# Patient Record
Sex: Female | Born: 1950
Health system: Southern US, Community
[De-identification: ages and names within clinical notes are randomized; demographics above are authoritative.]

## PROBLEM LIST (undated history)

## (undated) DIAGNOSIS — F32A Depression, unspecified: Secondary | ICD-10-CM

## (undated) DIAGNOSIS — F329 Major depressive disorder, single episode, unspecified: Secondary | ICD-10-CM

## (undated) DIAGNOSIS — C801 Malignant (primary) neoplasm, unspecified: Secondary | ICD-10-CM

## (undated) DIAGNOSIS — E039 Hypothyroidism, unspecified: Secondary | ICD-10-CM

## (undated) DIAGNOSIS — F419 Anxiety disorder, unspecified: Secondary | ICD-10-CM

## (undated) DIAGNOSIS — M1990 Unspecified osteoarthritis, unspecified site: Secondary | ICD-10-CM

## (undated) DIAGNOSIS — K635 Polyp of colon: Secondary | ICD-10-CM

## (undated) DIAGNOSIS — Z8719 Personal history of other diseases of the digestive system: Secondary | ICD-10-CM

## (undated) DIAGNOSIS — E785 Hyperlipidemia, unspecified: Secondary | ICD-10-CM

## (undated) DIAGNOSIS — G43909 Migraine, unspecified, not intractable, without status migrainosus: Secondary | ICD-10-CM

## (undated) HISTORY — DX: Personal history of other diseases of the digestive system: Z87.19

## (undated) HISTORY — DX: Hyperlipidemia, unspecified: E78.5

## (undated) HISTORY — DX: Polyp of colon: K63.5

## (undated) HISTORY — PX: ECTOPIC PREGNANCY SURGERY: SHX613

## (undated) HISTORY — PX: ESOPHAGOGASTRODUODENOSCOPY (EGD) WITH ESOPHAGEAL DILATION: SHX5812

## (undated) HISTORY — PX: ABDOMINAL HYSTERECTOMY: SHX81

## (undated) HISTORY — PX: TONSILLECTOMY: SUR1361

---

## 2000-06-10 ENCOUNTER — Encounter: Admission: RE | Admit: 2000-06-10 | Discharge: 2000-06-10 | Payer: Self-pay | Admitting: Obstetrics and Gynecology

## 2000-06-10 ENCOUNTER — Encounter: Payer: Self-pay | Admitting: Obstetrics and Gynecology

## 2000-11-05 ENCOUNTER — Inpatient Hospital Stay (HOSPITAL_COMMUNITY): Admission: AD | Admit: 2000-11-05 | Discharge: 2000-11-05 | Payer: Self-pay | Admitting: Obstetrics and Gynecology

## 2000-11-05 ENCOUNTER — Encounter: Payer: Self-pay | Admitting: Obstetrics and Gynecology

## 2000-11-17 ENCOUNTER — Encounter (INDEPENDENT_AMBULATORY_CARE_PROVIDER_SITE_OTHER): Payer: Self-pay | Admitting: Specialist

## 2000-11-17 ENCOUNTER — Observation Stay (HOSPITAL_COMMUNITY): Admission: RE | Admit: 2000-11-17 | Discharge: 2000-11-18 | Payer: Self-pay | Admitting: Obstetrics and Gynecology

## 2000-11-17 HISTORY — PX: BILATERAL SALPINGOOPHORECTOMY: SHX1223

## 2000-11-17 HISTORY — PX: APPENDECTOMY: SHX54

## 2001-05-12 ENCOUNTER — Encounter: Admission: RE | Admit: 2001-05-12 | Discharge: 2001-05-12 | Payer: Self-pay | Admitting: Obstetrics and Gynecology

## 2001-05-12 ENCOUNTER — Encounter: Payer: Self-pay | Admitting: Obstetrics and Gynecology

## 2002-05-15 ENCOUNTER — Encounter: Payer: Self-pay | Admitting: Obstetrics and Gynecology

## 2002-05-15 ENCOUNTER — Encounter: Admission: RE | Admit: 2002-05-15 | Discharge: 2002-05-15 | Payer: Self-pay | Admitting: Obstetrics and Gynecology

## 2002-09-10 ENCOUNTER — Encounter: Payer: Self-pay | Admitting: Internal Medicine

## 2002-09-10 ENCOUNTER — Ambulatory Visit (HOSPITAL_COMMUNITY): Admission: RE | Admit: 2002-09-10 | Discharge: 2002-09-10 | Payer: Self-pay | Admitting: Internal Medicine

## 2002-09-27 ENCOUNTER — Ambulatory Visit (HOSPITAL_COMMUNITY): Admission: RE | Admit: 2002-09-27 | Discharge: 2002-09-27 | Payer: Self-pay | Admitting: Internal Medicine

## 2003-05-17 ENCOUNTER — Encounter: Admission: RE | Admit: 2003-05-17 | Discharge: 2003-05-17 | Payer: Self-pay | Admitting: Obstetrics and Gynecology

## 2003-05-17 ENCOUNTER — Encounter: Payer: Self-pay | Admitting: Obstetrics and Gynecology

## 2009-12-11 ENCOUNTER — Ambulatory Visit: Payer: Self-pay | Admitting: Orthopedic Surgery

## 2009-12-11 DIAGNOSIS — M7710 Lateral epicondylitis, unspecified elbow: Secondary | ICD-10-CM | POA: Insufficient documentation

## 2010-11-03 ENCOUNTER — Ambulatory Visit (HOSPITAL_COMMUNITY)
Admission: RE | Admit: 2010-11-03 | Discharge: 2010-11-03 | Payer: Self-pay | Source: Home / Self Care | Attending: Internal Medicine | Admitting: Internal Medicine

## 2010-11-03 ENCOUNTER — Ambulatory Visit: Admit: 2010-11-03 | Payer: Self-pay | Admitting: Internal Medicine

## 2010-11-17 NOTE — Letter (Signed)
Summary: Generic Letter  [facility]  [address]  [place], [place] [address]   Phone: [phone]  Fax: [phone]    12/11/2009  APPLY ICE TO THE AREA OF MAXIMAL PAIN FOR 20 MINUTES two times a day   Fuller Canada MD

## 2010-11-17 NOTE — Letter (Signed)
Summary: History form  History form   Imported By: Jacklynn Ganong 12/23/2009 09:42:35  _____________________________________________________________________  External Attachment:    Type:   Image     Comment:   External Document

## 2010-11-17 NOTE — Assessment & Plan Note (Signed)
Summary: RT ELBOW PAIN/NEEDS XRAY/BCBS/CAF   Vital Signs:  Patient profile:   60 year old female Weight:      148 pounds Pulse rate:   62 / minute Resp:     18 per minute  Vitals Entered By: Fuller Canada MD (December 11, 2009 11:35 AM)  Visit Type:  Initial Consult Referring Provider:  self Primary Provider:  Dr. Ouida Sills  CC:  right elbow.  History of Present Illness: This is a 60 year old female with complaint of 4 months of atraumatic onset of RIGHT elbow pain.  She denies any trauma but she loves the garden and the yard and has started to prepare for the sprain.  She has sharp and dull throbbing stabbing moderate pain in the lateral side of the RIGHT elbow which is worse after any use of the RIGHT upper extremity.  She thinks it came on suddenly it is relieved with rest.  She has tried to take some Advil 400 mg on a p.r.n. basis and use ice with minimal to mild relief.  She did not have any numbness or tingling locking or catching.  She does note that she cannot pick up anything with any weight to it.   Needs xrays today.  Meds: Citalopram and Estradiol.    Allergies (verified): No Known Drug Allergies  Past History:  Past Medical History: morton's neuroma anxiety depression  Past Surgical History: hysterectomy  Family History: Family History Coronary Heart Disease female < 55  Social History: Patient is married.  no smoking occasional alcohol 2 cups of coffee per day.  Review of Systems General:  Denies weight loss, weight gain, fever, chills, and fatigue. Cardiac :  Denies chest pain, angina, heart attack, heart failure, poor circulation, blood clots, and phlebitis. Resp:  Denies short of breath, difficulty breathing, COPD, cough, and pneumonia; snoring. GI:  Denies nausea, vomiting, diarrhea, constipation, difficulty swallowing, ulcers, GERD, and reflux. GU:  Denies kidney failure, kidney transplant, kidney stones, burning, poor stream, testicular  cancer, blood in urine, and . Neuro:  Denies headache, dizziness, migraines, numbness, weakness, tremor, and unsteady walking. MS:  Denies joint pain, rheumatoid arthritis, joint swelling, gout, bone cancer, osteoporosis, and ; stiffness. Endo:  Denies thyroid disease, goiter, and diabetes. Psych:  Denies depression, mood swings, anxiety, panic attack, bipolar, and schizophrenia. Derm:  Denies eczema, cancer, and itching. EENT:  Complains of ears ringing; denies poor vision, cataracts, glaucoma, poor hearing, vertigo, sinusitis, hoarseness, toothaches, and bleeding gums; headache. Immunology:  Complains of seasonal allergies; denies sinus problems and allergic to bee stings. Lymphatic:  Denies lymph node cancer and lymph edema.  Physical Exam  Additional Exam:   VS reviewed and were normal  GEN: appearance was normal   CDV: normal pulses temperature and no edema  LYMPH nodes were normal   SKIN was normal   Neuro: normal sensation Psyche: AAO x 3 and mood was normal   MSK *Gait was normal   RIGHT elbow Inspection: Tenderness over the lateral epicondyle and radial head Range of motion full flexion extension and pronation supination Motor exam normal flexion and extension power valgus stress test normal pivot shift test normal  Tennis elbow test positive    Impression & Recommendations:  Problem # 1:  LATERAL EPICONDYLITIS (ICD-726.32) Assessment New  x-rays of the RIGHT elbow 2 views were ordered today  There is a slight spur on the medial side of the elbow at the humeral ulnar articulation but the ulnohumeral articulation radially and laterally is otherwise normal  Impression normal RIGHT elbow with the exception of a small ulnohumeral spur.  Inject RIGHT elbow The  elbow was prepped with alcohol and anesthetized with ethyl chloride. 40 mg of Depo-Medrol and 5 cc of 1% lidocaine were injected along the lateral epicondyle. No complications. Verbal consent was obtained  prior to injection.  Orders: New Patient Level III (84132) Joint Aspirate / Injection, Large (20610) Depo- Medrol 40mg  (J1030) Elbow x-ray, 2 views (44010)  Patient Instructions: 1)  You have Tennis Elbow  2)  wear the rarce and do the exrcises for 6 weeks  3)  Take Aleve daily for 2 weeks  4)  Please schedule a follow-up appointment as needed.

## 2010-11-20 NOTE — Op Note (Signed)
  Martha Hendricks, Martha Hendricks             ACCOUNT NO.:  [id number]  MEDICAL RECORD NO.:  [id number]          PATIENT TYPE:  AMB  LOCATION:  DAY                           FACILITY:  [facility]  PHYSICIAN:  Lionel December, M.D.    DATE OF BIRTH:  09-27-1951  DATE OF PROCEDURE:  11/03/2010 DATE OF DISCHARGE:                              OPERATIVE REPORT   PROCEDURE:  Colonoscopy.  INDICATIONS:  The patient is a 60 year old Caucasian female who is undergoing diagnostic colonoscopy.  She recently developed bloody diarrhea which resolved spontaneously.  The patient's last colonoscopy was over 8 years ago.  Family history is positive for various malignancies including colon carcinoma in paternal grandmother and two other second-degree relatives but they are all passed 81.  Procedure and risks were reviewed.  The patient's informed consent was obtained.  MEDICATIONS:  Conscious sedation with Demerol 40 mg IV, Versed 5 mg IV.  FINDINGS:  Procedure performed in endoscopy suite.  The patient's vital signs and O2 sats were monitored during the procedure and remained stable.  The patient was placed in left lateral position and rectal examination performed.  No abnormality noted on external or digital exam.  Pentax videoscope was placed in rectum and advanced under vision into sigmoid colon and beyond.  Preparation was excellent.  Scope was passed into cecum which was identified by appendiceal orifice and ileocecal valve.  Pictures taken for the record.  A single small diverticulum was also noted on the way in.  As the scope was withdrawn, colonic mucosa was carefully examined.  There was a 3-mm polyp at the transverse colon which was cold snared and retrieved for histologic examination.  Mucosa of rest of the colon was normal.  Rectal mucosa similarly was normal.  Scope was retroflexed and examined anorectal junction which was unremarkable.  Endoscope was then withdrawn. Withdrawal time was 13 minutes.   The patient tolerated the procedure well.  FINAL DIAGNOSIS: 1. Examination performed to cecum. 2. No evidence of endoscopic colitis. 3. Single diverticulum at sigmoid colon. 4. A 3-mm polyp cold snared from transverse colon.  RECOMMENDATIONS:  Standard instructions given.  I will be contacting the patient with results of biopsy and further recommendations.     Lionel December, M.D.     NR/MEDQ  D:  11/03/2010  T:  11/03/2010  Job:  [id number]  cc:   Kingsley Callander. Ouida Sills, MD Fax: [phone]  Electronically Signed by Lionel December M.D. on 11/20/2010 12:55:49 PM

## 2011-03-05 NOTE — Op Note (Signed)
NAME:  Martha Hendricks, Martha Hendricks                           ACCOUNT NO.:  [id number]   MEDICAL RECORD NO.:  [id number]                   PATIENT TYPE:  AMB   LOCATION:  DAY                                  FACILITY:  [facility]   PHYSICIAN:  Lionel December, M.D.                 DATE OF BIRTH:  05/14/1951   DATE OF PROCEDURE:  09/26/2002  DATE OF DISCHARGE:                                 OPERATIVE REPORT   PROCEDURE:  Total colonoscopy.   INDICATIONS:  The patient is a 60 year old Caucasian female who is  undergoing screening colonoscopy.  She does not have any GI symptoms.  Her  younger brother has had colonic polyps removed.  She has had two aunts on  her father's side with colon cancer, and she believes that her maternal  grandmother also had colon cancer.  The procedure risks were reviewed with  the patient and informed consent was obtained.   PREMEDICATION:  Demerol 50 mg IV in divided dose, Versed 5 mg IV in divided  dose.   INSTRUMENT USED:  Olympus video system.   FINDINGS:  Procedure performed in endoscopy suite.  The patient's vital  signs and O2 saturation were monitored during the procedure and remained  stable.  The patient was placed in the left lateral recumbent position and  rectal examination performed.  The pediatric scope was used.  The rectal  examination performed.  No abnormality noted on external or digital exam.  The scope was placed in the rectum and advanced under vision into sigmoid  colon.  Preparation was excellent.  Somewhat redundant sigmoid colon.  A  loop could never be reduced, able to advance the scope using abdominal  pressure.  The scope was passed into cecum, which was identified by  ileocecal valve.  Blunt end of the cecum was normal.  As the scope was  withdrawn, colonic mucosa was once again carefully examined.  There were two  small diverticula at the transverse colon, otherwise normal exam.  The  rectal mucosa was normal.  The scope was retroflexed to  examine the  anorectal junction, and small hemorrhoids were noted below the dentate line.  The endoscope was then withdrawn.  The patient tolerated the procedure well.   FINAL DIAGNOSES:  1. Examination performed to cecum.  2. Two small diverticula at transverse colon and small external hemorrhoids,     otherwise normal exam.  3. Somewhat redundant colon.    RECOMMENDATIONS:  1. She should continue yearly Hemoccults.  2. May consider having another exam in five to seven years.                                               Lionel December, M.D.    NR/MEDQ  D:  09/27/2002  T:  09/27/2002  Job:  [id number]   cc:   Kingsley Callander. Ouida Sills, M.D.  [address]  [place]  [place] [address]  Fax: [phone]   S. Kyra Manges, M.D.  [phone] [address]  [place]  [place] [address]  Fax: [phone]

## 2011-03-05 NOTE — Op Note (Signed)
[facility]  Patient:    FRADEL, BALDONADO                        MRN: [id number] Proc. Date: 11/17/00 Adm. Date:  [id number] Attending:  Lendon Colonel                           Operative Report  PREOPERATIVE DIAGNOSIS:   Cyclic abdominal pain, suspect endometriosis of ovaries.  POSTOPERATIVE DIAGNOSIS:  Cyclic abdominal pain, suspect endometriosis of ovaries.  OPERATION:  Laparoscopy with exploratory laparotomy, bilateral salpingo-oophorectomy and appendectomy.  SURGEON:  Katherine Roan, M.D.  DESCRIPTION OF PROCEDURE:   The patient was placed in the semilithotomy position for operative laparoscopy.  A transverse incision was made in the umbilicus and the abdomen was distended with CO2 under low pressure. Aspiration and infusion technique was utilized.  Visualization of the abdomen was accomplished after three liters of C02 were infused into the patients abdomen.  The ovary on the right was adherent to the pelvic side wall and stuck to the appendix with an inflammatory mass consistent with recent ruptured endometrioma.  The left ovary was somewhat adherent but not nearly as adherent as the right.  The ovary was stuck down to the right ureter.  The decision was made then to convert to an open laparotomy.  A transverse incision was made in the abdomen and extended in layers to the fascia which was opened transversely.  The peritoneum was entered and opened vertically and the ovary was in the infundibulopelvic ligament on the right.  It was elevated, skeletonized, and ligated with 0 chromic suture, and the ovary was freed up very carefully.  The adhesions were noted in the pelvic sidewall and over the left ureter.  Inflammatory mass was removed.  There was a bleeder on the surface of the pelvic sidewall and on the appendix on the surface of the colon.  This was sutured with 3-0 Vicryl suture.  There was oozing in the peritoneal reflection over  the right infundibulopelvic ligament which was likewise suture ligated.  The left ovary was removed fairly easily, isolating the infundibulopelvic ligament and ligating this with 0 chromic suture and removing the ovary fairly easily.  Hemostasis was secured since the tip of the appendix appeared to have endometriosis or be involved in endometriosis at the onset.  I removed the appendix using 3-0 Vicryl for the mesoappendix, crushing the base of the appendix and ligating it with 0 Vicryl and then inverting the stump with 3-0 Vicryl pursestring.  Copious amounts of irrigation were used to ensure hemostasis, and the parietal peritoneum was closed with 2-0 PDS as was the fascia and two interrupted sutures of 0 Vicryl were placed in the fascia. Subcutaneum was noted to be secure and irrigated with copious amounts of saline.  The skin was then closed with clips.  Dry sterile dressing was applied, and she was awakened and carried to the recovery room in good condition. DD:  11/17/00 TD:  11/17/00 Job: [id number]

## 2011-03-05 NOTE — H&P (Signed)
[facility]  Patient:    Martha Hendricks, Martha Hendricks                        MRN: [id number] Adm. Date:  [id number] Attending:  Lendon Colonel                         History and Physical  CHIEF COMPLAINT:  Continued abdominal pain.  HISTORY OF PRESENT ILLNESS:  Martha Hendricks is a 60 year old, gravida 3, para 2 female who presents for laparoscopy and laparoscopic bilateral oophorectomy for cyclic pain.  She has a history of ectopic pregnancy and two spontaneous deliveries.  At the time of laparoscopic-assisted hysterectomy in 1994, she had focal endometriosis of the uterosacral ligaments and of the ovary.  She continues to complain of cyclic pain.  Her only significant other surgery has been exploratory laparotomy for ectopic pregnancy in 1988 and she had a tubal ligation in 1989.  Because of the continued cyclic pain, bilateral salpingo-oophorectomy is recommended.  REVIEW OF SYSTEMS:  HEENT:  She wears glasses, but no headaches.  No decrease in vision or auditory acuity.  No dizziness.  Heart:  No rheumatic fever.  No chest pain.  No history of hypertension.  No history of mitral valve prolapse. Lungs:  No chronic cough.  No asthma.  No wheezing.  No hemoptysis.  GU:  She denies stress urinary incontinence.  No frequent UTIs.  GI:  No bowel habit change.  No anorexia.  No weight loss or gain.  Muscles, Bones, and Joints: No fractures or arthritis.  SOCIAL HISTORY:  She is a Runner, broadcasting/film/video.  She drinks alcohol socially.  She does not smoke.  FAMILY HISTORY:  Her mother is 5 and has COPD and high blood pressure, as well as congestive heart failure.  Her father is 79 and has Alzheimers.  He has had a coronary stent.  She is an only child.  She has a maternal uncle with lung cancer and a maternal aunt with ovarian cancer.  Her father has cancer of the prostate.  She has paternal aunts with colon cancer and a paternal grandmother with breast and colon cancer.  There is  no diabetes in the family.  PHYSICAL EXAMINATION:  A well-developed, well-nourished female who appears to be her stated age.  She is oriented to time, place, and recent events.  VITAL SIGNS:  The blood pressure is 90/60.  WEIGHT:  133 pounds.  HEENT:  Examination is unremarkable.  The oropharynx is not injected.  NECK:  Supple.  The thyroid is not enlarged.  Carotid pulses are equal without bruits.  No adenopathy appreciated.  BREASTS:  No masses or tenderness.  HEART:  Normal sinus rhythm.  No murmur.  LUNGS:  Clear to auscultation and percussion.  ABDOMEN:  Soft and flat.  Liver, spleen, and kidneys not palpated.  Bowel sounds are normal.  No masses are felt.  No bruits heard.  There is a transverse incision in the lower abdomen that appears to be well healed without evidence of hernia.  EXTREMITIES:  Good pulses and equal reflexes bilaterally.  PELVIC:  A well-supported vaginal vault.  No palpable masses.  There is some tenderness along the right uterosacral ligament.  NEUROLOGIC:  Cranial nerves are intact.  Reflexes in both upper extremities are equal.  IMPRESSION:  Cyclic abdominal pain.  Suspect endometriosis of the ovaries.  PLAN:  Laparoscopic bilateral salpingo-oophorectomy and possible laparotomy. Detailed informed consent  has been given to the patient in the form of risks of surgery, including bowel and vascular infection-type complications. DD:  11/17/00 TD:  11/17/00 Job: [id number]

## 2012-09-27 ENCOUNTER — Other Ambulatory Visit: Payer: Self-pay | Admitting: Obstetrics and Gynecology

## 2013-09-05 ENCOUNTER — Emergency Department (HOSPITAL_COMMUNITY)
Admission: EM | Admit: 2013-09-05 | Discharge: 2013-09-05 | Disposition: A | Discharge status: Home / Self Care | Payer: BC Managed Care – PPO | Attending: Emergency Medicine | Admitting: Emergency Medicine

## 2013-09-05 ENCOUNTER — Encounter (HOSPITAL_COMMUNITY): Payer: Self-pay | Admitting: Emergency Medicine

## 2013-09-05 ENCOUNTER — Emergency Department (HOSPITAL_COMMUNITY): Payer: BC Managed Care – PPO

## 2013-09-05 DIAGNOSIS — Z79899 Other long term (current) drug therapy: Secondary | ICD-10-CM | POA: Insufficient documentation

## 2013-09-05 DIAGNOSIS — R51 Headache: Secondary | ICD-10-CM | POA: Insufficient documentation

## 2013-09-05 DIAGNOSIS — I10 Essential (primary) hypertension: Secondary | ICD-10-CM | POA: Insufficient documentation

## 2013-09-05 HISTORY — DX: Migraine, unspecified, not intractable, without status migrainosus: G43.909

## 2013-09-05 MED ORDER — METOCLOPRAMIDE HCL 5 MG/ML IJ SOLN
10.0000 mg | Freq: Once | INTRAMUSCULAR | Status: AC
  Administered 2013-09-05: 10 mg via INTRAVENOUS
  Filled 2013-09-05: qty 2

## 2013-09-05 MED ORDER — HYDROMORPHONE HCL PF 1 MG/ML IJ SOLN
1.0000 mg | Freq: Once | INTRAMUSCULAR | Status: AC
  Administered 2013-09-05: 1 mg via INTRAVENOUS
  Filled 2013-09-05: qty 1

## 2013-09-05 MED ORDER — KETOROLAC TROMETHAMINE 30 MG/ML IJ SOLN
30.0000 mg | Freq: Once | INTRAMUSCULAR | Status: AC
  Administered 2013-09-05: 30 mg via INTRAVENOUS
  Filled 2013-09-05: qty 1

## 2013-09-05 MED ORDER — DIPHENHYDRAMINE HCL 50 MG/ML IJ SOLN
25.0000 mg | Freq: Once | INTRAMUSCULAR | Status: AC
  Administered 2013-09-05: 25 mg via INTRAVENOUS
  Filled 2013-09-05: qty 1

## 2013-09-05 NOTE — ED Notes (Signed)
Pt c/o severe, sudden headache that began approximately 45 minutes ago. Pt's BP was also elevated at time of onset. Pt states she received "upsetting news" and the headache began shortly after. Pt also reports nausea. Pt denies chest pain, weakness.

## 2013-09-05 NOTE — ED Notes (Signed)
Pt reports received some disturbing news and was upset.  Reports approx 6pm  Had "exploding" headache in the back of her head.  Family checked bp and reports was 160/101.  PT took 2 apap/butalbital/caff325-50-40mg  tabs

## 2013-09-05 NOTE — ED Provider Notes (Signed)
CSN: [id number]     Arrival date & time 09/05/13  1835 History   First MD Initiated Contact with Patient 09/05/13 1854     Chief Complaint  Patient presents with  . Headache  . Hypertension   (Consider location/radiation/quality/duration/timing/severity/associated sxs/prior Treatment) Patient is a 62 y.o. female presenting with headaches and hypertension. The history is provided by the patient (the pt states she was crying and upset and then started with a severe headache).  Headache Pain location:  Generalized Quality:  Dull Radiates to:  Does not radiate Severity currently:  8/10 Severity at highest:  9/10 Associated symptoms: no abdominal pain, no back pain, no congestion, no cough, no diarrhea, no fatigue, no seizures and no sinus pressure   Hypertension Associated symptoms include headaches. Pertinent negatives include no chest pain and no abdominal pain.    Past Medical History  Diagnosis Date  . Migraines    Past Surgical History  Procedure Laterality Date  . Abdominal hysterectomy    . Ectopic pregnancy surgery     No family history on file. History  Substance Use Topics  . Smoking status: Never Smoker   . Smokeless tobacco: Not on file  . Alcohol Use: Yes     Comment: occ   OB History   Grav Para Term Preterm Abortions TAB SAB Ect Mult Living                 Review of Systems  Constitutional: Negative for appetite change and fatigue.  HENT: Negative for congestion, ear discharge and sinus pressure.   Eyes: Negative for discharge.  Respiratory: Negative for cough.   Cardiovascular: Negative for chest pain.  Gastrointestinal: Negative for abdominal pain and diarrhea.  Genitourinary: Negative for frequency and hematuria.  Musculoskeletal: Negative for back pain.  Skin: Negative for rash.  Neurological: Positive for headaches. Negative for seizures.  Psychiatric/Behavioral: Negative for hallucinations.    Allergies  Morphine and related  Home  Medications   Current Outpatient Rx  Name  Route  Sig  Dispense  Refill  . butalbital-acetaminophen-caffeine (FIORICET, ESGIC) 50-325-40 MG per tablet   Oral   Take 1 tablet by mouth every 6 (six) hours as needed for headache.          . citalopram (CELEXA) 20 MG tablet   Oral   Take 20 mg by mouth daily.         Marland Kitchen estradiol (ESTRACE) 0.5 MG tablet   Oral   Take 1 tablet by mouth 2 (two) times daily.          BP 124/64  Pulse 72  Resp 14  SpO2 96% Physical Exam  Constitutional: She is oriented to person, place, and time. She appears well-developed.  HENT:  Head: Normocephalic.  Eyes: Conjunctivae and EOM are normal. No scleral icterus.  Neck: Neck supple. No thyromegaly present.  Cardiovascular: Normal rate and regular rhythm.  Exam reveals no gallop and no friction rub.   No murmur heard. Pulmonary/Chest: No stridor. She has no wheezes. She has no rales. She exhibits no tenderness.  Abdominal: She exhibits no distension. There is no tenderness. There is no rebound.  Musculoskeletal: Normal range of motion. She exhibits no edema.  Lymphadenopathy:    She has no cervical adenopathy.  Neurological: She is oriented to person, place, and time. She exhibits normal muscle tone. Coordination normal.  Skin: No rash noted. No erythema.  Psychiatric: She has a normal mood and affect. Her behavior is normal.    ED  Course  Procedures (including critical care time) Labs Review Labs Reviewed - No data to display Imaging Review Ct Head Wo Contrast  09/05/2013   CLINICAL DATA:  Headache  EXAM: CT HEAD WITHOUT CONTRAST  TECHNIQUE: Contiguous axial images were obtained from the base of the skull through the vertex without intravenous contrast.  COMPARISON:  None.  FINDINGS: Minimal right-sided periventricular hypodensities suggestive of microvascular ischemic disease (representative axial images 19 and 20, series 2). Gray-white differentiation is otherwise well maintained. No CT  evidence of acute large territory infarct. No intraparenchymal or extra-axial mass or hemorrhage. Normal size and configuration of the ventricles and basilar cisterns. No midline shift. Limited visualization the paranasal sinuses and mastoid air cells are normal. Regional soft tissues are normal. No displaced calvarial fracture.  IMPRESSION: Minimal microvascular ischemic disease without acute intracranial process.   Electronically Signed   By: Simonne Come M.D.   On: 09/05/2013 19:44    EKG Interpretation   None       MDM   1. Headache    Pt improved with tx    Benny Lennert, MD 09/05/13 [phone]

## 2013-09-27 ENCOUNTER — Other Ambulatory Visit: Payer: Self-pay | Admitting: Obstetrics and Gynecology

## 2014-10-24 ENCOUNTER — Other Ambulatory Visit: Payer: Self-pay | Admitting: Obstetrics and Gynecology

## 2014-10-25 LAB — CYTOLOGY - PAP

## 2015-02-17 ENCOUNTER — Ambulatory Visit (HOSPITAL_COMMUNITY)
Admission: RE | Admit: 2015-02-17 | Discharge: 2015-02-17 | Disposition: A | Discharge status: Home / Self Care | Payer: BC Managed Care – PPO | Source: Ambulatory Visit | Attending: Internal Medicine | Admitting: Internal Medicine

## 2015-02-17 ENCOUNTER — Other Ambulatory Visit (HOSPITAL_COMMUNITY): Payer: Self-pay | Admitting: Internal Medicine

## 2015-02-17 DIAGNOSIS — R221 Localized swelling, mass and lump, neck: Secondary | ICD-10-CM

## 2015-02-17 DIAGNOSIS — E042 Nontoxic multinodular goiter: Secondary | ICD-10-CM | POA: Insufficient documentation

## 2015-02-18 ENCOUNTER — Other Ambulatory Visit (HOSPITAL_COMMUNITY): Payer: Self-pay | Admitting: Internal Medicine

## 2015-02-18 DIAGNOSIS — E041 Nontoxic single thyroid nodule: Secondary | ICD-10-CM

## 2015-02-19 ENCOUNTER — Ambulatory Visit (HOSPITAL_COMMUNITY)
Admission: RE | Admit: 2015-02-19 | Discharge: 2015-02-19 | Disposition: A | Discharge status: Home / Self Care | Payer: BC Managed Care – PPO | Source: Ambulatory Visit | Attending: Internal Medicine | Admitting: Internal Medicine

## 2015-02-19 DIAGNOSIS — E041 Nontoxic single thyroid nodule: Secondary | ICD-10-CM | POA: Diagnosis present

## 2015-02-19 MED ORDER — LIDOCAINE HCL (PF) 2 % IJ SOLN
INTRAMUSCULAR | Status: AC
  Filled 2015-02-19: qty 10

## 2015-02-19 MED ORDER — LIDOCAINE HCL (PF) 2 % IJ SOLN
INTRAMUSCULAR | Status: AC
  Administered 2015-02-19: 10 mL
  Filled 2015-02-19: qty 10

## 2015-02-19 NOTE — Procedures (Signed)
PreOperative Dx: RIGHT lobe thyroid mass Postoperative Dx: RIGHT lobe thyroid mass Procedure:   US guided FNA of RIGHT thyroid mass Radiologist:  Thornton Papas Anesthesia:  2 ml of 2% lidocaine Specimen:  FNA x 4  EBL:   < 1 ml Complications: None

## 2015-02-19 NOTE — Discharge Instructions (Signed)
Thyroid Biopsy °The thyroid gland is a butterfly-shaped gland situated in the front of the neck. It produces hormones which affect metabolism, growth and development, and body temperature. A thyroid biopsy is a procedure in which small samples of tissue or fluid are removed from the thyroid gland or mass and examined under a microscope. This test is done to determine the cause of thyroid problems, such as infection, cancer, or other thyroid problems. °There are 2 ways to obtain samples: °1. Fine needle biopsy. Samples are removed using a thin needle inserted through the skin and into the thyroid gland or mass. °2. Open biopsy. Samples are removed after a cut (incision) is made through the skin. °LET YOUR CAREGIVER KNOW ABOUT:  °· Allergies. °· Medications taken including herbs, eye drops, over-the-counter medications, and creams. °· Use of steroids (by mouth or creams). °· Previous problems with anesthetics or numbing medicine. °· Possibility of pregnancy, if this applies. °· History of blood clots (thrombophlebitis). °· History of bleeding or blood problems. °· Previous surgery. °· Other health problems. °RISKS AND COMPLICATIONS °· Bleeding from the site. The risk of bleeding is higher if you have a bleeding disorder or are taking any blood thinning medications (anticoagulants). °· Infection. °· Injury to structures near the thyroid gland. °BEFORE THE PROCEDURE  °This is a procedure that can be done as an outpatient. Confirm the time that you need to arrive for your procedure. Confirm whether there is a need to fast or withhold any medications. A blood sample may be done to determine your blood clotting time. Medicine may be given to help you relax (sedative). °PROCEDURE °Fine needle biopsy. °You will be awake during the procedure. You may be asked to lie on your back with your head tipped backward to extend your neck. Let your caregiver know if you cannot tolerate the positioning. An area on your neck will be  cleansed. A needle is inserted through the skin of your neck. You may feel a mild discomfort during this procedure. You may be asked to avoid coughing, talking, swallowing, or making sounds during some portions of the procedure. The needle is withdrawn once tissue or fluid samples have been removed. Pressure may be applied to the neck to reduce swelling and ensure that bleeding has stopped. The samples will be sent for examination.  °Open biopsy. °You will be given general anesthesia. You will be asleep during the procedure. An incision is made in your neck. A sample of thyroid tissue or the mass is removed. The tissue sample or mass will be sent for examination. The sample or mass may be examined during the biopsy. If the sample or mass contains cancer cells, some or all of the thyroid gland may be removed. The incision is closed with stitches. °AFTER THE PROCEDURE  °Your recovery will be assessed and monitored. If there are no problems, as an outpatient, you should be able to go home shortly after the procedure. °If you had a fine needle biopsy: °· You may have soreness at the biopsy site for 1 to 2 days. °If you had an open biopsy:  °· You may have soreness at the biopsy site for 3 to 4 days. °· You may have a hoarse voice or sore throat for 1 to 2 days. °Obtaining the Test Results °It is your responsibility to obtain your test results. Do not assume everything is normal if you have not heard from your caregiver or the medical facility. It is important for you to follow up   on all of your test results. °HOME CARE INSTRUCTIONS  °· Keeping your head raised on a pillow when you are lying down may ease biopsy site discomfort. °· Supporting the back of your head and neck with both hands as you sit up from a lying position may ease biopsy site discomfort. °· Only take over-the-counter or prescription medicines for pain, discomfort, or fever as directed by your caregiver. °· Throat lozenges or gargling with warm salt  water may help to soothe a sore throat. °SEEK IMMEDIATE MEDICAL CARE IF:  °· You have severe bleeding from the biopsy site. °· You have difficulty swallowing. °· You have a fever. °· You have increased pain, swelling, redness, or warmth at the biopsy site. °· You notice pus coming from the biopsy site. °· You have swollen glands (lymph nodes) in your neck. °Document Released: 08/01/2007 Document Revised: 01/29/2013 Document Reviewed: 12/27/2013 °ExitCare® Patient Information ©2015 [employer]. This information is not intended to replace advice given to you by your health care provider. Make sure you discuss any questions you have with your health care provider. ° °

## 2015-03-04 ENCOUNTER — Other Ambulatory Visit: Payer: Self-pay | Admitting: Otolaryngology

## 2015-03-04 DIAGNOSIS — D440 Neoplasm of uncertain behavior of thyroid gland: Secondary | ICD-10-CM

## 2015-03-11 ENCOUNTER — Ambulatory Visit
Admission: RE | Admit: 2015-03-11 | Discharge: 2015-03-11 | Disposition: A | Discharge status: Home / Self Care | Payer: BC Managed Care – PPO | Source: Ambulatory Visit | Attending: Otolaryngology | Admitting: Otolaryngology

## 2015-03-11 DIAGNOSIS — D440 Neoplasm of uncertain behavior of thyroid gland: Secondary | ICD-10-CM

## 2015-03-11 MED ORDER — IOHEXOL 300 MG/ML  SOLN
75.0000 mL | Freq: Once | INTRAMUSCULAR | Status: AC | PRN
  Administered 2015-03-11: 75 mL via INTRAVENOUS

## 2015-03-18 NOTE — Pre-Procedure Instructions (Signed)
    BECCA BAYNE  03/18/2015      [place] [place] [address] [place] [address] Phone: [phone] Fax: [phone]    Your procedure is scheduled on 03/21/15.  Report to [facility] Admitting at 7 A.M.  Call this number if you have problems the morning of surgery:  [phone]   Remember:  Do not eat food or drink liquids after midnight.  Take these medicines the morning of surgery with A SIP OF WATER fioricet,celexa,estrace   Do not wear jewelry, make-up or nail polish.  Do not wear lotions, powders, or perfumes.  You may wear deodorant.  Do not shave 48 hours prior to surgery.  Men may shave face and neck.  Do not bring valuables to the hospital.  [facility] is not responsible for any belongings or valuables.  Contacts, dentures or bridgework may not be worn into surgery.  Leave your suitcase in the car.  After surgery it may be brought to your room.  For patients admitted to the hospital, discharge time will be determined by your treatment team.  Patients discharged the day of surgery will not be allowed to drive home.   Name and phone number of your driver:    Special instructions:   Please read over the following fact sheets that you were given. Pain Booklet, Coughing and Deep Breathing and Surgical Site Infection Prevention

## 2015-03-19 ENCOUNTER — Encounter (HOSPITAL_COMMUNITY): Payer: Self-pay

## 2015-03-19 ENCOUNTER — Encounter (HOSPITAL_COMMUNITY)
Admission: RE | Admit: 2015-03-19 | Discharge: 2015-03-19 | Disposition: A | Discharge status: Home / Self Care | Payer: BC Managed Care – PPO | Source: Ambulatory Visit | Attending: Otolaryngology | Admitting: Otolaryngology

## 2015-03-19 ENCOUNTER — Other Ambulatory Visit: Payer: Self-pay | Admitting: Otolaryngology

## 2015-03-19 HISTORY — DX: Unspecified osteoarthritis, unspecified site: M19.90

## 2015-03-19 HISTORY — DX: Major depressive disorder, single episode, unspecified: F32.9

## 2015-03-19 HISTORY — DX: Depression, unspecified: F32.A

## 2015-03-19 HISTORY — DX: Hypothyroidism, unspecified: E03.9

## 2015-03-19 HISTORY — DX: Anxiety disorder, unspecified: F41.9

## 2015-03-19 LAB — COMPREHENSIVE METABOLIC PANEL
ALT: 15 U/L (ref 14–54)
AST: 17 U/L (ref 15–41)
Albumin: 3.7 g/dL (ref 3.5–5.0)
Alkaline Phosphatase: 47 U/L (ref 38–126)
Anion gap: 6 (ref 5–15)
BUN: 7 mg/dL (ref 6–20)
CO2: 28 mmol/L (ref 22–32)
Calcium: 8.5 mg/dL — ABNORMAL LOW (ref 8.9–10.3)
Chloride: 105 mmol/L (ref 101–111)
Creatinine, Ser: 0.81 mg/dL (ref 0.44–1.00)
GFR calc Af Amer: >60 mL/min (ref >60)
GFR calc non Af Amer: >60 mL/min (ref >60)
Glucose, Bld: 91 mg/dL (ref 65–99)
Potassium: 4 mmol/L (ref 3.5–5.1)
Sodium: 139 mmol/L (ref 135–145)
Total Bilirubin: 0.5 mg/dL (ref 0.3–1.2)
Total Protein: 6.7 g/dL (ref 6.5–8.1)

## 2015-03-19 LAB — CBC
HCT: 39.9 % (ref 36.0–46.0)
Hemoglobin: 13.5 g/dL (ref 12.0–15.0)
MCH: 30.3 pg (ref 26.0–34.0)
MCHC: 33.8 g/dL (ref 30.0–36.0)
MCV: 89.7 fL (ref 78.0–100.0)
Platelets: 182 10*3/uL (ref 150–400)
RBC: 4.45 MIL/uL (ref 3.87–5.11)
RDW: 12.9 % (ref 11.5–15.5)
WBC: 5.2 10*3/uL (ref 4.0–10.5)

## 2015-03-19 NOTE — H&P (Signed)
Martha Hendricks,  Martha Hendricks 63 y.o., female [id number]     Chief Complaint: RIGHT thyroid mass  HPI: 63-year-old white female sent down by Dr. Fagan from  for evaluation of a news suspicious RIGHT thyroid mass.  She noticed this roughly 2 weeks ago while washing her neck.   An ultrasound demonstrated a 3 cm heterogeneous solid mass with possible microcalcifications.  She underwent a fine-needle aspiration of this mass, ultrasound guided.  This showed a Bethesda class V lesion suspicious for papillary neoplasm.  TSH was 2.7 in early May.  It was 4.0 in March.  No past history of thyroid problems.  No history of neck irradiation.  No family history of thyroid cancer.  The mass does not cause any symptoms including pain, difficulty breathing, or difficulty swallowing.  She does not smoke.  Two-week recheck for preoperative evaluation.  Next week we are planning to do a RIGHT thyroid lobectomy with frozen section, possible total thyroidectomy.  I discussed the surgery with her in detail including risks and complications.  Special mention was made of the recurrent laryngeal nerves and the parathyroid glands.  Questions were answered and informed consent was obtained.  Advancement of diet and activity was discussed.  The possibility of postoperative thyroid replacement therapy and possible radioactive iodine ablation was discussed.  CT scan earlier today showed the RIGHT thyroid mass but no other thyroid masses, and no significant lymph nodes especially level IV, V, or VI.  I left her a prescription for oxycodone for pain relief.  I will have her wash the night before and the morning of surgery with Hibiclens solution.  She will spend 1-2 nights in the hospital.  I will see her back here in the office 10 days after surgery for suture removal.  PMH: Past Medical History  Diagnosis Date  . Migraines   . Depression   . Anxiety   . Arthritis   . Hypothyroidism     Surg Hx: Past Surgical History   Procedure Laterality Date  . Abdominal hysterectomy    . Ectopic pregnancy surgery    . Tonsillectomy      FHx:  No family history on file. SocHx:  reports that she has never smoked. She does not have any smokeless tobacco history on file. She reports that she drinks alcohol. She reports that she does not use illicit drugs.  ALLERGIES:  Allergies  Allergen Reactions  . Morphine And Related Nausea Only     (Not in a hospital admission)  Results for orders placed or performed during the hospital encounter of 03/19/15 (from the past 48 hour(s))  Comprehensive metabolic panel     Status: Abnormal   Collection Time: 03/19/15  1:31 PM  Result Value Ref Range   Sodium 139 135 - 145 mmol/L   Potassium 4.0 3.5 - 5.1 mmol/L   Chloride 105 101 - 111 mmol/L   CO2 28 22 - 32 mmol/L   Glucose, Bld 91 65 - 99 mg/dL   BUN 7 6 - 20 mg/dL   Creatinine, Ser 0.81 0.44 - 1.00 mg/dL   Calcium 8.5 (L) 8.9 - 10.3 mg/dL   Total Protein 6.7 6.5 - 8.1 g/dL   Albumin 3.7 3.5 - 5.0 g/dL   AST 17 15 - 41 U/L   ALT 15 14 - 54 U/L   Alkaline Phosphatase 47 38 - 126 U/L   Total Bilirubin 0.5 0.3 - 1.2 mg/dL   GFR calc non Af Amer >60 >60 mL/min   GFR calc   Af Amer >60 >60 mL/min    Comment: (NOTE) The eGFR has been calculated using the CKD EPI equation. This calculation has not been validated in all clinical situations. eGFR's persistently <60 mL/min signify possible Chronic Kidney Disease.    Anion gap 6 5 - 15  CBC     Status: None   Collection Time: 03/19/15  1:31 PM  Result Value Ref Range   WBC 5.2 4.0 - 10.5 K/uL   RBC 4.45 3.87 - 5.11 MIL/uL   Hemoglobin 13.5 12.0 - 15.0 g/dL   HCT 39.9 36.0 - 46.0 %   MCV 89.7 78.0 - 100.0 fL   MCH 30.3 26.0 - 34.0 pg   MCHC 33.8 30.0 - 36.0 g/dL   RDW 12.9 11.5 - 15.5 %   Platelets 182 150 - 400 K/uL   No results found.  ROS:Systemic: Feeling tired (fatigue).  No fever, no night sweats, and no recent weight loss. Head: Headache. Eyes: No eye  symptoms. Otolaryngeal: No hearing loss  and no earache.  Tinnitus.  No purulent nasal discharge.  No nasal passage blockage (stuffiness), no snoring, no sneezing, no hoarseness, and no sore throat. Cardiovascular: No chest pain or discomfort  and no palpitations. Pulmonary: No dyspnea, no cough, and no wheezing. Gastrointestinal: No dysphagia  and no heartburn.  No nausea, no abdominal pain, and no melena.  No diarrhea. Genitourinary: No dysuria. Endocrine: No muscle weakness. Musculoskeletal: No calf muscle cramps, no arthralgias, and no soft tissue swelling. Neurological: No dizziness, no fainting, no tingling, and no numbness. Psychological: No anxiety  and no depression. Skin: No rash.  BP:112/59,  HR: 79 b/min,  Height: 5 ft 5 in, Weight: 146 lb , BMI: 24.3 kg/m2  PHYSICAL EXAM: She is cheerful and healthy appearing.  Mental status is sharp.  She hears well and conversational speech.  Voice is clear and respirations unlabored through the nose.  The head is atraumatic and neck supple.  Cranial nerves intact.  Ear canals are clear with normal drums.  Anterior nose is moist and patent.  Oral cavity is clear with moist membranes and teeth in good repair.  Oropharynx clear.  Hypopharynx and larynx by mirror examination show a slight overhanging epiglottis, but fully mobile vocal cords on both sides.  No pooling in vallecula piriforms.  Neck examination is remarkable for a firm 3-4 cm RIGHT thyroid mass.  No other palpable thyroid masses or adenopathy  Lungs: Clear to auscultation Heart: Regular rate and rhythm without murmurs Abdomen: Soft, active Extremities: Normal configuration Neurologic: Symmetric, grossly intact.  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  Your surgery is scheduled for next Friday.  you will be in the hospital one or 2 days.  I am leaving you a prescription for pain medication.   Depending on whether we take out the whole thyroid gland, or just part, you may also need a prescription for thyroid replacement medication.  Buy some Hibiclens (chlorhexidine) liquid surgical soap at the drug store and shower with this the night before surgery and the morning of, with special emphasis to your neck and upper chest.  No strenuous activities for 2 weeks after surgery.  I will see you back here 10 days after surgery please.  Oxycodone-Acetaminophen 5-325 MG Oral Tablet;1/2-2 tabs po q4h prn severe pain; Qty30; R0; Rx.  Chrystian Ressler 03/19/2015, 7:54 PM     

## 2015-03-21 ENCOUNTER — Encounter (HOSPITAL_COMMUNITY)
Admission: RE | Disposition: A | Discharge status: Home / Self Care | Payer: Self-pay | Source: Ambulatory Visit | Attending: Otolaryngology

## 2015-03-21 ENCOUNTER — Encounter (HOSPITAL_COMMUNITY): Payer: Self-pay | Admitting: *Deleted

## 2015-03-21 ENCOUNTER — Inpatient Hospital Stay (HOSPITAL_COMMUNITY)
Admission: RE | Admit: 2015-03-21 | Discharge: 2015-03-23 | DRG: 627 | Disposition: A | Discharge status: Home / Self Care | Payer: BC Managed Care – PPO | Source: Ambulatory Visit | Attending: Otolaryngology | Admitting: Otolaryngology

## 2015-03-21 ENCOUNTER — Ambulatory Visit (HOSPITAL_COMMUNITY): Payer: BC Managed Care – PPO | Admitting: Anesthesiology

## 2015-03-21 DIAGNOSIS — M1990 Unspecified osteoarthritis, unspecified site: Secondary | ICD-10-CM | POA: Diagnosis present

## 2015-03-21 DIAGNOSIS — C73 Malignant neoplasm of thyroid gland: Secondary | ICD-10-CM | POA: Diagnosis present

## 2015-03-21 DIAGNOSIS — E039 Hypothyroidism, unspecified: Secondary | ICD-10-CM | POA: Diagnosis present

## 2015-03-21 HISTORY — PX: THYROIDECTOMY: SHX17

## 2015-03-21 SURGERY — THYROIDECTOMY
Anesthesia: General | Site: Neck

## 2015-03-21 MED ORDER — MEPERIDINE HCL 25 MG/ML IJ SOLN: 6.2500 mg | INTRAMUSCULAR | Status: DC | PRN

## 2015-03-21 MED ORDER — CITALOPRAM HYDROBROMIDE 20 MG PO TABS
20.0000 mg | ORAL_TABLET | Freq: Every day | ORAL | Status: DC
  Administered 2015-03-21: 20 mg via ORAL
  Filled 2015-03-21 (×3): qty 1

## 2015-03-21 MED ORDER — 0.9 % SODIUM CHLORIDE (POUR BTL) OPTIME
TOPICAL | Status: DC | PRN
  Administered 2015-03-21: 1000 mL

## 2015-03-21 MED ORDER — OXYCODONE-ACETAMINOPHEN 5-325 MG PO TABS
ORAL_TABLET | ORAL | Status: AC
  Filled 2015-03-21: qty 2

## 2015-03-21 MED ORDER — OXYCODONE-ACETAMINOPHEN 5-325 MG PO TABS
1.0000 | ORAL_TABLET | ORAL | Status: DC | PRN
  Administered 2015-03-21 (×2): 2 via ORAL
  Administered 2015-03-22: 1 via ORAL
  Administered 2015-03-22: 2 via ORAL
  Filled 2015-03-21: qty 1
  Filled 2015-03-21 (×2): qty 2

## 2015-03-21 MED ORDER — CHLORHEXIDINE GLUCONATE 4 % EX LIQD: 1.0000 "application " | Freq: Once | CUTANEOUS | Status: DC

## 2015-03-21 MED ORDER — ONDANSETRON HCL 4 MG/2ML IJ SOLN
INTRAMUSCULAR | Status: DC | PRN
Start: 2015-03-21 — End: 2015-03-21
  Administered 2015-03-21: 4 mg via INTRAVENOUS

## 2015-03-21 MED ORDER — ONDANSETRON HCL 4 MG PO TABS: 4.0000 mg | ORAL_TABLET | ORAL | Status: DC | PRN

## 2015-03-21 MED ORDER — PROMETHAZINE HCL 25 MG/ML IJ SOLN: 6.2500 mg | INTRAMUSCULAR | Status: DC | PRN

## 2015-03-21 MED ORDER — LIDOCAINE HCL (CARDIAC) 20 MG/ML IV SOLN
INTRAVENOUS | Status: DC | PRN
  Administered 2015-03-21: 100 mg via INTRAVENOUS

## 2015-03-21 MED ORDER — HYDROMORPHONE HCL 1 MG/ML IJ SOLN
INTRAMUSCULAR | Status: AC
  Filled 2015-03-21: qty 1

## 2015-03-21 MED ORDER — MIDAZOLAM HCL 5 MG/5ML IJ SOLN
INTRAMUSCULAR | Status: DC | PRN
  Administered 2015-03-21: 2 mg via INTRAVENOUS

## 2015-03-21 MED ORDER — ESTRADIOL 1 MG PO TABS
0.5000 mg | ORAL_TABLET | Freq: Two times a day (BID) | ORAL | Status: DC
  Administered 2015-03-21 – 2015-03-22 (×3): 0.5 mg via ORAL
  Filled 2015-03-21 (×4): qty 1

## 2015-03-21 MED ORDER — LEVOTHYROXINE SODIUM 100 MCG PO TABS: 100.0000 ug | ORAL_TABLET | Freq: Every day | ORAL | Status: DC

## 2015-03-21 MED ORDER — EPHEDRINE SULFATE 50 MG/ML IJ SOLN
INTRAMUSCULAR | Status: AC
  Filled 2015-03-21: qty 1

## 2015-03-21 MED ORDER — LIDOCAINE-EPINEPHRINE 1 %-1:100000 IJ SOLN
INTRAMUSCULAR | Status: DC | PRN
  Administered 2015-03-21: 4 mL

## 2015-03-21 MED ORDER — CALCIUM CARBONATE ANTACID 500 MG PO CHEW
400.0000 mg | CHEWABLE_TABLET | Freq: Two times a day (BID) | ORAL | Status: DC
  Administered 2015-03-21 – 2015-03-22 (×3): 400 mg via ORAL
  Filled 2015-03-21 (×3): qty 2

## 2015-03-21 MED ORDER — PROPOFOL 10 MG/ML IV BOLUS
INTRAVENOUS | Status: AC
  Filled 2015-03-21: qty 20

## 2015-03-21 MED ORDER — LACTATED RINGERS IV SOLN
INTRAVENOUS | Status: DC
  Administered 2015-03-21: 50 mL/h via INTRAVENOUS
  Administered 2015-03-21: 11:00:00 via INTRAVENOUS

## 2015-03-21 MED ORDER — BACITRACIN ZINC 500 UNIT/GM EX OINT
1.0000 "application " | TOPICAL_OINTMENT | Freq: Three times a day (TID) | CUTANEOUS | Status: DC
  Administered 2015-03-21 – 2015-03-23 (×5): 1 via TOPICAL
  Filled 2015-03-21: qty 28.35

## 2015-03-21 MED ORDER — OXYCODONE-ACETAMINOPHEN 5-325 MG PO TABS: 1.0000 | ORAL_TABLET | ORAL | Status: DC | PRN

## 2015-03-21 MED ORDER — BUTALBITAL-APAP-CAFFEINE 50-325-40 MG PO TABS: 1.0000 | ORAL_TABLET | Freq: Four times a day (QID) | ORAL | Status: DC | PRN

## 2015-03-21 MED ORDER — PHENOL 1.4 % MT LIQD: 2.0000 | Freq: Three times a day (TID) | OROMUCOSAL | Status: DC | PRN

## 2015-03-21 MED ORDER — DEXAMETHASONE SODIUM PHOSPHATE 10 MG/ML IJ SOLN
INTRAMUSCULAR | Status: AC
  Filled 2015-03-21: qty 1

## 2015-03-21 MED ORDER — BACITRACIN ZINC 500 UNIT/GM EX OINT
TOPICAL_OINTMENT | CUTANEOUS | Status: DC | PRN
  Administered 2015-03-21: 1 via TOPICAL

## 2015-03-21 MED ORDER — PROPOFOL 10 MG/ML IV BOLUS
INTRAVENOUS | Status: DC | PRN
  Administered 2015-03-21: 160 mg via INTRAVENOUS

## 2015-03-21 MED ORDER — PHENYLEPHRINE HCL 10 MG/ML IJ SOLN: INTRAMUSCULAR | Status: DC | PRN

## 2015-03-21 MED ORDER — FENTANYL CITRATE (PF) 250 MCG/5ML IJ SOLN
INTRAMUSCULAR | Status: AC
  Filled 2015-03-21: qty 5

## 2015-03-21 MED ORDER — LIDOCAINE-EPINEPHRINE 1 %-1:100000 IJ SOLN
INTRAMUSCULAR | Status: AC
  Filled 2015-03-21: qty 1

## 2015-03-21 MED ORDER — LIDOCAINE HCL (CARDIAC) 20 MG/ML IV SOLN
INTRAVENOUS | Status: AC
  Filled 2015-03-21: qty 5

## 2015-03-21 MED ORDER — ONDANSETRON HCL 4 MG/2ML IJ SOLN
INTRAMUSCULAR | Status: AC
  Filled 2015-03-21: qty 2

## 2015-03-21 MED ORDER — CALCIUM CARBONATE ANTACID 500 MG PO CHEW: 400.0000 mg | CHEWABLE_TABLET | Freq: Two times a day (BID) | ORAL | Status: DC

## 2015-03-21 MED ORDER — CALCIUM CARBONATE-VITAMIN D 500-200 MG-UNIT PO TABS
2.0000 | ORAL_TABLET | Freq: Two times a day (BID) | ORAL | Status: DC
  Administered 2015-03-21: 2 via ORAL
  Filled 2015-03-21: qty 2

## 2015-03-21 MED ORDER — ARTIFICIAL TEARS OP OINT
TOPICAL_OINTMENT | OPHTHALMIC | Status: AC
  Filled 2015-03-21: qty 3.5

## 2015-03-21 MED ORDER — SUCCINYLCHOLINE CHLORIDE 20 MG/ML IJ SOLN
INTRAMUSCULAR | Status: AC
  Filled 2015-03-21: qty 1

## 2015-03-21 MED ORDER — STERILE WATER FOR INJECTION IJ SOLN
INTRAMUSCULAR | Status: AC
  Filled 2015-03-21: qty 10

## 2015-03-21 MED ORDER — HYDROMORPHONE HCL 1 MG/ML IJ SOLN: 0.2500 mg | INTRAMUSCULAR | Status: DC | PRN

## 2015-03-21 MED ORDER — LEVOTHYROXINE SODIUM 100 MCG PO TABS
100.0000 ug | ORAL_TABLET | Freq: Every day | ORAL | Status: DC
  Administered 2015-03-22 – 2015-03-23 (×2): 100 ug via ORAL
  Filled 2015-03-21 (×2): qty 1

## 2015-03-21 MED ORDER — EPHEDRINE SULFATE 50 MG/ML IJ SOLN
INTRAMUSCULAR | Status: DC | PRN
  Administered 2015-03-21 (×2): 10 mg via INTRAVENOUS
  Administered 2015-03-21 (×2): 5 mg via INTRAVENOUS

## 2015-03-21 MED ORDER — ONDANSETRON HCL 4 MG/2ML IJ SOLN: 4.0000 mg | INTRAMUSCULAR | Status: DC | PRN

## 2015-03-21 MED ORDER — DEXAMETHASONE SODIUM PHOSPHATE 10 MG/ML IJ SOLN
INTRAMUSCULAR | Status: DC | PRN
  Administered 2015-03-21: 10 mg via INTRAVENOUS

## 2015-03-21 MED ORDER — DEXTROSE-NACL 5-0.45 % IV SOLN: INTRAVENOUS | Status: DC

## 2015-03-21 MED ORDER — DIPHENHYDRAMINE HCL 50 MG/ML IJ SOLN
25.0000 mg | Freq: Four times a day (QID) | INTRAMUSCULAR | Status: DC | PRN
  Administered 2015-03-22: 25 mg via INTRAVENOUS
  Filled 2015-03-21: qty 1

## 2015-03-21 MED ORDER — BACITRACIN ZINC 500 UNIT/GM EX OINT
TOPICAL_OINTMENT | CUTANEOUS | Status: AC
  Filled 2015-03-21: qty 28.35

## 2015-03-21 MED ORDER — FENTANYL CITRATE (PF) 100 MCG/2ML IJ SOLN
INTRAMUSCULAR | Status: DC | PRN
  Administered 2015-03-21 (×4): 50 ug via INTRAVENOUS
  Administered 2015-03-21: 100 ug via INTRAVENOUS

## 2015-03-21 MED ORDER — MIDAZOLAM HCL 2 MG/2ML IJ SOLN
INTRAMUSCULAR | Status: AC
  Filled 2015-03-21: qty 2

## 2015-03-21 MED ORDER — ROCURONIUM BROMIDE 50 MG/5ML IV SOLN
INTRAVENOUS | Status: AC
  Filled 2015-03-21: qty 1

## 2015-03-21 MED ORDER — ACETAMINOPHEN 80 MG PO CHEW
320.0000 mg | CHEWABLE_TABLET | ORAL | Status: DC | PRN
  Filled 2015-03-21: qty 4

## 2015-03-21 MED ORDER — HYDROMORPHONE HCL 1 MG/ML IJ SOLN
0.5000 mg | INTRAMUSCULAR | Status: DC | PRN
Start: 2015-03-21 — End: 2015-03-23
  Administered 2015-03-21 (×2): 0.5 mg via INTRAVENOUS
  Filled 2015-03-21: qty 1

## 2015-03-21 SURGICAL SUPPLY — 61 items
APPLIER CLIP 9.375 SM OPEN (CLIP) ×3
APR CLP SM 9.3 20 MLT OPN (CLIP) ×1
ATTRACTOMAT 16X20 MAGNETIC DRP (DRAPES) ×3 IMPLANT
BLADE SURG 10 STRL SS (BLADE) ×3 IMPLANT
BLADE SURG 15 STRL LF DISP TIS (BLADE) ×1 IMPLANT
BLADE SURG 15 STRL SS (BLADE) ×3
BLADE SURG ROTATE 9660 (MISCELLANEOUS) IMPLANT
CANISTER SUCTION 2500CC (MISCELLANEOUS) ×3 IMPLANT
CLEANER TIP ELECTROSURG 2X2 (MISCELLANEOUS) ×3 IMPLANT
CLIP APPLIE 9.375 SM OPEN (CLIP) ×1 IMPLANT
CLOSURE WOUND 1/2 X4 (GAUZE/BANDAGES/DRESSINGS)
CONT SPEC 4OZ CLIKSEAL STRL BL (MISCELLANEOUS) ×4 IMPLANT
CORDS BIPOLAR (ELECTRODE) ×3 IMPLANT
COVER SURGICAL LIGHT HANDLE (MISCELLANEOUS) ×3 IMPLANT
CRADLE DONUT ADULT HEAD (MISCELLANEOUS) ×2 IMPLANT
DRAIN SNY 10 ROU (WOUND CARE) IMPLANT
DRAIN WOUND SNY 15 RND (WOUND CARE) ×2 IMPLANT
DRAPE PROXIMA HALF (DRAPES) IMPLANT
ELECT COATED BLADE 2.86 ST (ELECTRODE) ×3 IMPLANT
ELECT REM PT RETURN 9FT ADLT (ELECTROSURGICAL) ×3
ELECTRODE REM PT RTRN 9FT ADLT (ELECTROSURGICAL) ×1 IMPLANT
EVACUATOR SILICONE 100CC (DRAIN) ×3 IMPLANT
GAUZE SPONGE 4X4 16PLY XRAY LF (GAUZE/BANDAGES/DRESSINGS) IMPLANT
GLOVE BIO SURGEON STRL SZ 6.5 (GLOVE) ×1 IMPLANT
GLOVE BIO SURGEONS STRL SZ 6.5 (GLOVE) ×1
GLOVE BIOGEL PI IND STRL 7.5 (GLOVE) IMPLANT
GLOVE BIOGEL PI IND STRL 8 (GLOVE) IMPLANT
GLOVE BIOGEL PI INDICATOR 7.5 (GLOVE) ×2
GLOVE BIOGEL PI INDICATOR 8 (GLOVE) ×2
GLOVE ECLIPSE 8.0 STRL XLNG CF (GLOVE) ×3 IMPLANT
GLOVE SURG SS PI 7.0 STRL IVOR (GLOVE) ×2 IMPLANT
GLOVE SURG SS PI 8.0 STRL IVOR (GLOVE) ×2 IMPLANT
GOWN STRL REUS W/ TWL LRG LVL3 (GOWN DISPOSABLE) ×2 IMPLANT
GOWN STRL REUS W/ TWL XL LVL3 (GOWN DISPOSABLE) ×1 IMPLANT
GOWN STRL REUS W/TWL LRG LVL3 (GOWN DISPOSABLE) ×6
GOWN STRL REUS W/TWL XL LVL3 (GOWN DISPOSABLE) ×6
KIT BASIN OR (CUSTOM PROCEDURE TRAY) ×3 IMPLANT
KIT ROOM TURNOVER OR (KITS) ×3 IMPLANT
LOCATOR NERVE 3 VOLT (DISPOSABLE) IMPLANT
NDL HYPO 25GX1X1/2 BEV (NEEDLE) IMPLANT
NEEDLE HYPO 25GX1X1/2 BEV (NEEDLE) ×3 IMPLANT
NS IRRIG 1000ML POUR BTL (IV SOLUTION) ×3 IMPLANT
PAD ARMBOARD 7.5X6 YLW CONV (MISCELLANEOUS) ×6 IMPLANT
PENCIL BUTTON HOLSTER BLD 10FT (ELECTRODE) ×3 IMPLANT
PROBE NERVBE PRASS .33 (MISCELLANEOUS) ×2 IMPLANT
SCRUB FOAM CHG 2% SURGICAL (MISCELLANEOUS) ×2 IMPLANT
SHEARS HARMONIC 9CM CVD (BLADE) ×3 IMPLANT
SPONGE INTESTINAL PEANUT (DISPOSABLE) IMPLANT
STAPLER VISISTAT 35W (STAPLE) ×3 IMPLANT
STRIP CLOSURE SKIN 1/2X4 (GAUZE/BANDAGES/DRESSINGS) IMPLANT
SUT CHROMIC 3 0 PS 2 (SUTURE) IMPLANT
SUT CHROMIC 4 0 PS 2 18 (SUTURE) ×3 IMPLANT
SUT ETHILON 3 0 PS 1 (SUTURE) ×3 IMPLANT
SUT ETHILON 5 0 PS 2 18 (SUTURE) ×3 IMPLANT
SUT SILK 2 0 SH CR/8 (SUTURE) ×3 IMPLANT
SUT SILK 3 0 REEL (SUTURE) ×2 IMPLANT
TOWEL OR 17X24 6PK STRL BLUE (TOWEL DISPOSABLE) ×3 IMPLANT
TRAY ENT MC OR (CUSTOM PROCEDURE TRAY) ×3 IMPLANT
TRAY FOLEY CATH 14FRSI W/METER (CATHETERS) IMPLANT
TUBE ENDOTRAC EMG 7X10.2 (MISCELLANEOUS) ×2 IMPLANT
WATER STERILE IRR 1000ML POUR (IV SOLUTION) ×1 IMPLANT

## 2015-03-21 NOTE — H&P (View-Only) (Signed)
Martha Hendricks, Milosevic 64 y.o., female [id number]     Chief Complaint: RIGHT thyroid mass  HPI: 64 year old white female sent down by Dr. Willey Blade from [place] for evaluation of a news suspicious RIGHT thyroid mass.  She noticed this roughly 2 weeks ago while washing her neck.   An ultrasound demonstrated a 3 cm heterogeneous solid mass with possible microcalcifications.  She underwent a fine-needle aspiration of this mass, ultrasound guided.  This showed a Bethesda class V lesion suspicious for papillary neoplasm.  TSH was 2.7 in early May.  It was 4.0 in March.  No past history of thyroid problems.  No history of neck irradiation.  No family history of thyroid cancer.  The mass does not cause any symptoms including pain, difficulty breathing, or difficulty swallowing.  She does not smoke.  Two-week recheck for preoperative evaluation.  Next week we are planning to do a RIGHT thyroid lobectomy with frozen section, possible total thyroidectomy.  I discussed the surgery with her in detail including risks and complications.  Special mention was made of the recurrent laryngeal nerves and the parathyroid glands.  Questions were answered and informed consent was obtained.  Advancement of diet and activity was discussed.  The possibility of postoperative thyroid replacement therapy and possible radioactive iodine ablation was discussed.  CT scan earlier today showed the RIGHT thyroid mass but no other thyroid masses, and no significant lymph nodes especially level IV, V, or VI.  I left her a prescription for oxycodone for pain relief.  I will have her wash the night before and the morning of surgery with Hibiclens solution.  She will spend 1-2 nights in the hospital.  I will see her back here in the office 10 days after surgery for suture removal.  PMH: Past Medical History  Diagnosis Date  . Migraines   . Depression   . Anxiety   . Arthritis   . Hypothyroidism     Surg Hx: Past Surgical History   Procedure Laterality Date  . Abdominal hysterectomy    . Ectopic pregnancy surgery    . Tonsillectomy      FHx:  No family history on file. SocHx:  reports that she has never smoked. She does not have any smokeless tobacco history on file. She reports that she drinks alcohol. She reports that she does not use illicit drugs.  ALLERGIES:  Allergies  Allergen Reactions  . Morphine And Related Nausea Only     (Not in a hospital admission)  Results for orders placed or performed during the hospital encounter of 03/19/15 (from the past 48 hour(s))  Comprehensive metabolic panel     Status: Abnormal   Collection Time: 03/19/15  1:31 PM  Result Value Ref Range   Sodium 139 135 - 145 mmol/L   Potassium 4.0 3.5 - 5.1 mmol/L   Chloride 105 101 - 111 mmol/L   CO2 28 22 - 32 mmol/L   Glucose, Bld 91 65 - 99 mg/dL   BUN 7 6 - 20 mg/dL   Creatinine, Ser 0.81 0.44 - 1.00 mg/dL   Calcium 8.5 (L) 8.9 - 10.3 mg/dL   Total Protein 6.7 6.5 - 8.1 g/dL   Albumin 3.7 3.5 - 5.0 g/dL   AST 17 15 - 41 U/L   ALT 15 14 - 54 U/L   Alkaline Phosphatase 47 38 - 126 U/L   Total Bilirubin 0.5 0.3 - 1.2 mg/dL   GFR calc non Af Amer >60 >60 mL/min   GFR calc  Af Amer >60 >60 mL/min    Comment: (NOTE) The eGFR has been calculated using the CKD EPI equation. This calculation has not been validated in all clinical situations. eGFR's persistently <60 mL/min signify possible Chronic Kidney Disease.    Anion gap 6 5 - 15  CBC     Status: None   Collection Time: 03/19/15  1:31 PM  Result Value Ref Range   WBC 5.2 4.0 - 10.5 K/uL   RBC 4.45 3.87 - 5.11 MIL/uL   Hemoglobin 13.5 12.0 - 15.0 g/dL   HCT 39.9 36.0 - 46.0 %   MCV 89.7 78.0 - 100.0 fL   MCH 30.3 26.0 - 34.0 pg   MCHC 33.8 30.0 - 36.0 g/dL   RDW 12.9 11.5 - 15.5 %   Platelets 182 150 - 400 K/uL   No results found.  QJF:[id number]: Feeling tired (fatigue).  No fever, no night sweats, and no recent weight loss. Head: Headache. Eyes: No eye  symptoms. Otolaryngeal: No hearing loss  and no earache.  Tinnitus.  No purulent nasal discharge.  No nasal passage blockage (stuffiness), no snoring, no sneezing, no hoarseness, and no sore throat. Cardiovascular: No chest pain or discomfort  and no palpitations. Pulmonary: No dyspnea, no cough, and no wheezing. Gastrointestinal: No dysphagia  and no heartburn.  No nausea, no abdominal pain, and no melena.  No diarrhea. Genitourinary: No dysuria. Endocrine: No muscle weakness. Musculoskeletal: No calf muscle cramps, no arthralgias, and no soft tissue swelling. Neurological: No dizziness, no fainting, no tingling, and no numbness. Psychological: No anxiety  and no depression. Skin: No rash.  BP:112/59,  HR: 79 b/min,  Height: 5 ft 5 in, Weight: 146 lb , BMI: 24.3 kg/m2  PHYSICAL EXAM: She is cheerful and healthy appearing.  Mental status is sharp.  She hears well and conversational speech.  Voice is clear and respirations unlabored through the nose.  The head is atraumatic and neck supple.  Cranial nerves intact.  Ear canals are clear with normal drums.  Anterior nose is moist and patent.  Oral cavity is clear with moist membranes and teeth in good repair.  Oropharynx clear.  Hypopharynx and larynx by mirror examination show a slight overhanging epiglottis, but fully mobile vocal cords on both sides.  No pooling in vallecula piriforms.  Neck examination is remarkable for a firm 3-4 cm RIGHT thyroid mass.  No other palpable thyroid masses or adenopathy  Lungs: Clear to auscultation Heart: Regular rate and rhythm without murmurs Abdomen: Soft, active Extremities: Normal configuration Neurologic: Symmetric, grossly intact.  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  Your surgery is scheduled for next Friday.  you will be in the hospital one or 2 days.  I am leaving you a prescription for pain medication.   Depending on whether we take out the whole thyroid gland, or just part, you may also need a prescription for thyroid replacement medication.  Buy some Hibiclens (chlorhexidine) liquid surgical soap at the drug store and shower with this the night before surgery and the morning of, with special emphasis to your neck and upper chest.  No strenuous activities for 2 weeks after surgery.  I will see you back here 10 days after surgery please.  Oxycodone-Acetaminophen 5-325 MG Oral Tablet;1/2-2 tabs po q4h prn severe pain; LSL37; R0; Rx.  Jodi Marble 12/20/2874, 7:54 PM

## 2015-03-21 NOTE — Anesthesia Postprocedure Evaluation (Signed)
  Anesthesia Post-op Note  Patient: Martha Hendricks  Procedure(s) Performed: Procedure(s): TOTAL THYROIDECTOMY (N/A)  Patient Location: PACU  Anesthesia Type:General  Level of Consciousness: awake  Airway and Oxygen Therapy: Patient Spontanous Breathing  Post-op Pain: mild  Post-op Assessment: Post-op Vital signs reviewed  Post-op Vital Signs: Reviewed  Last Vitals:  Filed Vitals:   03/21/15 1245  BP:   Pulse:   Temp: 37 C  Resp:     Complications: No apparent anesthesia complications

## 2015-03-21 NOTE — Transfer of Care (Signed)
Immediate Anesthesia Transfer of Care Note  Patient: Martha Hendricks  Procedure(s) Performed: Procedure(s): TOTAL THYROIDECTOMY (N/A)  Patient Location: PACU  Anesthesia Type:General  Level of Consciousness: awake, alert , oriented and sedated  Airway & Oxygen Therapy: Patient Spontanous Breathing and Patient connected to nasal cannula oxygen  Post-op Assessment: Report given to RN, Post -op Vital signs reviewed and stable and Patient moving all extremities  Post vital signs: Reviewed and stable  Last Vitals:  Filed Vitals:   03/21/15 0717  BP: 143/77  Pulse: 69  Temp: 36.5 C  Resp: 18    Complications: No apparent anesthesia complications

## 2015-03-21 NOTE — Anesthesia Procedure Notes (Signed)
Procedure Name: Intubation Date/Time: 03/21/2015 9:39 AM Performed by: Scheryl Darter Pre-anesthesia Checklist: Patient identified, Emergency Drugs available, Suction available and Patient being monitored Patient Re-evaluated:Patient Re-evaluated prior to inductionOxygen Delivery Method: Circle system utilized Preoxygenation: Pre-oxygenation with 100% oxygen Intubation Type: IV induction Ventilation: Mask ventilation without difficulty Laryngoscope Size: Miller Grade View: Grade I Tube type: Oral Tube size: 7.0 (NIMS ETT) mm Number of attempts: 1 Airway Equipment and Method: Stylet Placement Confirmation: ETT inserted through vocal cords under direct vision,  positive ETCO2 and breath sounds checked- equal and bilateral Secured at: 22 cm Tube secured with: Tape Dental Injury: Teeth and Oropharynx as per pre-operative assessment

## 2015-03-21 NOTE — Progress Notes (Signed)
Subjective: POD#0 from total thyroidectomy for right papillary thyroid carcinoma. Doing well in PACU, pain controlled  Objective: Vital signs in last 24 hours: Temp:  [97.7 F (36.5 C)-98.6 F (37 C)] 98.6 F (37 C) (06/03 1245) Pulse Rate:  [69-103] 101 (06/03 1430) Resp:  [10-22] 20 (06/03 1430) BP: (134-151)/(65-77) 134/71 mmHg (06/03 1430) SpO2:  [97 %-98 %] 97 % (06/03 1430) Weight:  [65.772 kg (145 lb)] 65.772 kg (145 lb) (06/03 0717)  Neck supple, drain in place and holding bulb suction with sanguinous drainage. Incision clean dry and intact with sutures. Strong voice, trachea midline.  @LABLAST2 (wbc:2,hgb:2,hct:2,plt:2)  Recent Labs  03/19/15 1331  NA 139  K 4.0  CL 105  CO2 28  GLUCOSE 91  BUN 7  CREATININE 0.81  CALCIUM 8.5*    Medications:  Scheduled Meds: . bacitracin  1 application Topical 3 times per day  . calcium carbonate  400 mg of elemental calcium Oral BID  . calcium-vitamin D  2 tablet Oral BID  . citalopram  20 mg Oral Daily  . estradiol  0.5 mg Oral BID  . HYDROmorphone      . HYDROmorphone      . [START ON 03/22/2015] levothyroxine  100 mcg Oral QAC breakfast  . oxyCODONE-acetaminophen       Continuous Infusions: . dextrose 5 % and 0.45% NaCl     PRN Meds:.acetaminophen, butalbital-acetaminophen-caffeine, diphenhydrAMINE, HYDROmorphone (DILAUDID) injection, HYDROmorphone (DILAUDID) injection, meperidine (DEMEROL) injection, ondansetron **OR** ondansetron (ZOFRAN) IV, oxyCODONE-acetaminophen, phenol, promethazine  Assessment/Plan: Will monitor drain output and calcium levels. Continue post-op care.     Ruby Cola 03/21/2015, 3:38 PM

## 2015-03-21 NOTE — Interval H&P Note (Signed)
History and Physical Interval Note:  03/21/2015 8:58 AM  Martha Hendricks  has presented today for surgery, with the diagnosis of right thyroid papillary neoplasm  The various methods of treatment have been discussed with the patient and family. After consideration of risks, benefits and other options for treatment, the patient has consented to  Procedure(s): RIGHT THYROID LOBECTOMY WITH FROZEN SECTION POSSIBLE THYROIDECTOMY (Right) as a surgical intervention .  The patient's history has been re-reviewed, patient re-examined, no change in status, stable for surgery.  I have re-reviewed the patient's chart and labs.  Questions were answered to the patient's satisfaction.    Chvostek's sign negative on both sides.   Jodi Marble

## 2015-03-21 NOTE — Op Note (Signed)
03/21/2015  12:47 PM    Burman Freestone  [id number]   Pre-Op Dx:  Right thyroid mass, presumed papillary carcinoma  Post-op Dx: Papillary carcinoma of the thyroid  Proc: Right thyroid lobectomy with frozen section, completion thyroidectomy   Surg:  Jodi Marble T MD  Anes:  GOT  EBL:  25 mL  Comp:  None  Findings:  4 cm roughly spherical mass of the right thyroid, rubbery firm. No significant attachment to adjacent tissues. No palpable masses in the opposite lobe. No adenopathy.   Procedure: With the patient in a comfortable supine position, having previously been marked in the holding area, general orotracheal anesthesia was induced using a Xomed nerve monitor tube.  At an appropriate level, the patient was placed in a slight reverse Trendelenburg. A shoulder roll was placed the neck was extended and the head was supported in the standard fashion. The neck was palpated with the findings as described above. The proposed incision site was infiltrated with 4 mL's 1% Xylocaine with 1 100,000 epinephrine. Several minutes were allowed for this to take effect. A chlorhexidine sterile preparation and draping of the entire neck was accomplished.  The neck was palpated again and the landmarks were marked. A 6 cm incision was marked measured and marked with a midline crosshatch.  This was sharply executed through the skin into the subcutaneous fat. Cautery was used to carry the incision through the platysma muscle. The midline raphae of the strap muscles were identified and divided vertically. The strap muscles were retracted laterally in 2 layers down to the isthmus of the thyroid gland.  The right-sided strap muscles were dissected off of the surface of the thyroid lobe. Beginning at the isthmus and working superiorly and laterally, the soft tissue around the superior pole was controlled with Harmonic scalpel and also with silk ligatures. Dissection was carried over the tumor mass laterally.  Dissection was carried inferiorly around the inferior pole again with the Harmonic scalpel and silk ligatures. At least one parathyroid gland was identified and easily preserved. Working from inferiorly, dissection was carried directly on the capsule of the tumor superiorly which was then rolled medially. Approaching the cricothyroid tracheal junction, the recurrent laryngeal nerve was identified and dissected minimally for protection. Specimen was rolled forward away from the nerve and Berry's ligament was divided.   The isthmus was crossclamped and divided and the left side controlled with a figure-of-eight 2-0 silk suture.    the right thyroid lobe was  dissected off the face of the trachea and delivered as specimen and sent for frozen section interpretation.    the frozen section interpretation with papillary carcinoma of the thyroid. A completion thyroidectomy was elected. The husband was notified of this decision.   Starting at the ligated isthmus, the dissection was carried superiorly and inferiorly around the superior and inferior poles of the thyroid again using the harmonic scalpel and silk ligatures. Strap muscles were dissected off the lateral surface of the lobe. Lobe was rolled medially. Working in the carotid esophageal groove, and dissecting directly on the capsule of the gland, a parathyroid gland was identified and preserved. The recurrent laryngeal nerve was easily identified. The gland was rolled off and Berry's ligament was divided. This lobe was delivered and sent for permanent interpretation.     A small amount of bipolar cautery in each bed was required for final hemostasis, working well away from the nerve. Again at least one parathyroid gland was identified in each bed intact. At this  point the wound was thoroughly irrigated. A 15 mm perforated round drain was placed through a separate puncture and laid in the thyroid bed. This was secured to the skin with a 3-0 nylon  stitch.  midline raphe of the strap muscles was reapproximated. Shoulder roll was removed.  The wound was closed with interrupted 4-0 chromic in the platysma/subcutaneous layer .   The drain was noted to be functional and not leaking air. The skin was closed with a running simple 5-0 Ethilon stitch. The area was cleaned and bacitracin ointment was applied.   At this point the procedure was completed. The patient was returned to anesthesia, awakened, extubated, and transferred to recovery in stable condition.    Dispo:   PACU to 6 N.   Plan:  Analgesia, ice, elevation, monitoring for calcium and wound drainage.   anticipate discharge from the hospital in 24-48 hours. Will use calcium supplementation and begin Synthroid therapy. We will have her see an endocrinologist postoperatively.     Tyson Alias MD

## 2015-03-21 NOTE — Anesthesia Preprocedure Evaluation (Signed)
Anesthesia Evaluation  Patient identified by MRN, date of birth, ID band Patient awake    Reviewed: Allergy & Precautions, NPO status , Patient's Chart, lab work & pertinent test results  Airway Mallampati: II  TM Distance: >3 FB Neck ROM: Full    Dental   Pulmonary neg pulmonary ROS,  breath sounds clear to auscultation        Cardiovascular negative cardio ROS  Rhythm:Regular Rate:Normal     Neuro/Psych    GI/Hepatic negative GI ROS, Neg liver ROS,   Endo/Other  Hypothyroidism   Renal/GU negative Renal ROS     Musculoskeletal   Abdominal   Peds  Hematology   Anesthesia Other Findings   Reproductive/Obstetrics                             Anesthesia Physical Anesthesia Plan  ASA: II  Anesthesia Plan: General   Post-op Pain Management:    Induction: Intravenous  Airway Management Planned: Oral ETT  Additional Equipment:   Intra-op Plan:   Post-operative Plan: Possible Post-op intubation/ventilation  Informed Consent: I have reviewed the patients History and Physical, chart, labs and discussed the procedure including the risks, benefits and alternatives for the proposed anesthesia with the patient or authorized representative who has indicated his/her understanding and acceptance.   Dental advisory given  Plan Discussed with: CRNA and Anesthesiologist  Anesthesia Plan Comments:         Anesthesia Quick Evaluation

## 2015-03-22 DIAGNOSIS — C73 Malignant neoplasm of thyroid gland: Secondary | ICD-10-CM | POA: Diagnosis present

## 2015-03-22 LAB — COMPREHENSIVE METABOLIC PANEL
ALT: 14 U/L (ref 14–54)
AST: 20 U/L (ref 15–41)
Albumin: 3 g/dL — ABNORMAL LOW (ref 3.5–5.0)
Alkaline Phosphatase: 41 U/L (ref 38–126)
Anion gap: 7 (ref 5–15)
BUN: 7 mg/dL (ref 6–20)
CO2: 28 mmol/L (ref 22–32)
CREATININE: 0.71 mg/dL (ref 0.44–1.00)
Calcium: 7.8 mg/dL — ABNORMAL LOW (ref 8.9–10.3)
Chloride: 102 mmol/L (ref 101–111)
GFR calc Af Amer: >60 mL/min (ref >60)
Glucose, Bld: 179 mg/dL — ABNORMAL HIGH (ref 65–99)
Potassium: 4.2 mmol/L (ref 3.5–5.1)
Sodium: 137 mmol/L (ref 135–145)
TOTAL PROTEIN: 5.6 g/dL — AB (ref 6.5–8.1)
Total Bilirubin: 0.4 mg/dL (ref 0.3–1.2)

## 2015-03-22 MED ORDER — DOCUSATE SODIUM 100 MG PO CAPS: 200.0000 mg | ORAL_CAPSULE | Freq: Two times a day (BID) | ORAL | Status: DC | PRN

## 2015-03-22 MED ORDER — SODIUM CHLORIDE 0.9 % IV SOLN
2.0000 g | Freq: Once | INTRAVENOUS | Status: AC
  Administered 2015-03-22: 2 g via INTRAVENOUS
  Filled 2015-03-22: qty 20

## 2015-03-22 MED ORDER — ACETAMINOPHEN 500 MG PO TABS
500.0000 mg | ORAL_TABLET | ORAL | Status: DC | PRN
  Administered 2015-03-22 – 2015-03-23 (×3): 500 mg via ORAL
  Filled 2015-03-22 (×3): qty 1

## 2015-03-22 MED ORDER — CALCIUM CARBONATE-VITAMIN D 500-200 MG-UNIT PO TABS
2.0000 | ORAL_TABLET | Freq: Three times a day (TID) | ORAL | Status: DC
  Administered 2015-03-22 – 2015-03-23 (×4): 2 via ORAL
  Filled 2015-03-22 (×4): qty 2

## 2015-03-22 NOTE — Progress Notes (Signed)
03/22/2015  Subjective: POD#1 from total thyroidectomy, doing well  Objective: Vital signs in last 24 hours: Temp:  [97.9 F (36.6 C)-99 F (37.2 C)] 99 F (37.2 C) (06/04 0525) Pulse Rate:  [73-103] 73 (06/04 0525) Resp:  [10-24] 18 (06/04 0525) BP: (107-151)/(50-75) 107/50 mmHg (06/04 0525) SpO2:  [95 %-98 %] 98 % (06/04 0525)  Neck supple with JP holding suction and serosanguinous drainage, strong voice and neck flat.  @LABLAST2 (wbc:2,hgb:2,hct:2,plt:2)  Recent Labs  03/19/15 1331 03/22/15 0433  NA 139 137  K 4.0 4.2  CL 105 102  CO2 28 28  GLUCOSE 91 179*  BUN 7 7  CREATININE 0.81 0.71  CALCIUM 8.5* 7.8*    Medications:  Scheduled Meds: . bacitracin  1 application Topical 3 times per day  . calcium carbonate  400 mg of elemental calcium Oral BID  . calcium gluconate  2 g Intravenous Once  . calcium-vitamin D  2 tablet Oral TID WC  . citalopram  20 mg Oral Daily  . estradiol  0.5 mg Oral BID  . levothyroxine  100 mcg Oral QAC breakfast   Continuous Infusions: . dextrose 5 % and 0.45% NaCl     PRN Meds:.acetaminophen, butalbital-acetaminophen-caffeine, diphenhydrAMINE, docusate sodium, HYDROmorphone (DILAUDID) injection, ondansetron **OR** ondansetron (ZOFRAN) IV, oxyCODONE-acetaminophen, phenol  Assessment/Plan: Calcium trended down to 7.8 despite PO calcium and vitamin D, and drain put out 32mL. I recommended IV calcium x 1 and increasing the Os-cal to TID, and monitoring the calcium levels and drain output at least one more day. Will reassess Sunday. Otherwise doing well and continue post-op care.   LOS: 1 day   Ruby Cola 03/22/2015, 8:00 AM

## 2015-03-23 LAB — COMPREHENSIVE METABOLIC PANEL
ALK PHOS: 39 U/L (ref 38–126)
ALT: 15 U/L (ref 14–54)
ANION GAP: 7 (ref 5–15)
AST: 18 U/L (ref 15–41)
Albumin: 3 g/dL — ABNORMAL LOW (ref 3.5–5.0)
BILIRUBIN TOTAL: 0.3 mg/dL (ref 0.3–1.2)
BUN: 8 mg/dL (ref 6–20)
CHLORIDE: 102 mmol/L (ref 101–111)
CO2: 30 mmol/L (ref 22–32)
CREATININE: 0.75 mg/dL (ref 0.44–1.00)
Calcium: 8.5 mg/dL — ABNORMAL LOW (ref 8.9–10.3)
GFR calc Af Amer: >60 mL/min (ref >60)
GFR calc non Af Amer: >60 mL/min (ref >60)
GLUCOSE: 95 mg/dL (ref 65–99)
POTASSIUM: 4.1 mmol/L (ref 3.5–5.1)
SODIUM: 139 mmol/L (ref 135–145)
Total Protein: 5.9 g/dL — ABNORMAL LOW (ref 6.5–8.1)

## 2015-03-23 LAB — PHOSPHORUS: PHOSPHORUS: 1.8 mg/dL — AB (ref 2.5–4.6)

## 2015-03-23 LAB — MAGNESIUM: MAGNESIUM: 1.7 mg/dL (ref 1.7–2.4)

## 2015-03-23 LAB — CALCIUM, IONIZED: Calcium, Ionized, Serum: 4.1 mg/dL — ABNORMAL LOW (ref 4.5–5.6)

## 2015-03-23 MED ORDER — ONDANSETRON HCL 4 MG PO TABS: 4.0000 mg | ORAL_TABLET | ORAL | Status: DC | PRN

## 2015-03-23 MED ORDER — LEVOTHYROXINE SODIUM 100 MCG PO TABS: 100.0000 ug | ORAL_TABLET | Freq: Every day | ORAL | Status: DC

## 2015-03-23 MED ORDER — CALCIUM CARBONATE ANTACID 500 MG PO CHEW: 400.0000 mg | CHEWABLE_TABLET | Freq: Three times a day (TID) | ORAL | Status: DC

## 2015-03-23 NOTE — Discharge Summary (Signed)
03/23/2015  7:33 AM  Date of Admission: 03/21/2015 Date of Discharge: 03/23/2015  Discharge MD: Ruby Cola, MD  Admitting MD: Jodi Marble, MD  Reason for admission/final discharge diagnosis: papillary thyroid carcinoma  Labs: see EPIC, calcium 8.5 and trending upward at time of discharge  Procedure(s) performed: total thyroidectomy 03/21/2015  Discharge Condition: good  Discharge Exam: neck supple, incision clean dry and intact, strong voice, JP drain removed and site dressed.  Discharge Instructions: No heavy lifting for 2 weeks, follow up with  Dr. Erik Obey At [facility] this Friday. Call ENT on call with any hand or face cramping or signs/symptoms of hypocalcemia. Rx on chart for percocet but may take regular tylenol or ibuprofen PRN pain. Take OTC OsCal with D (calcium with vitamin D) OR TUMS (preferably the OsCal with vitamin D) 2 tablets three times per day. Dress incision with OTC neosporin TID.  Hospital Course: did well post-op, calcium trended upward and drain output trended downward appropriately and drain removed and patient discharged on POD#2 in good condition.  Ruby Cola 7:33 AM 03/23/2015

## 2015-03-23 NOTE — Discharge Instructions (Signed)
Thyroidectomy Thyroidectomy is the removal of part or all of your thyroid gland. Your thyroid gland is a butterfly-shaped gland at the base of your neck. It produces a substance called thyroid hormone, which regulates the physical and chemical processes that keep your body functioning and make energy available to your body (metabolism). The amount of thyroid gland tissue that is removed during a thyroidectomy depends on the reason for the procedure. Typically, if only a part of your gland is removed, enough thyroid gland tissue remains to maintain normal function. If your entire thyroid gland is removed or if the amount of thyroid gland tissue remaining is inadequate to maintain normal function, you will need life-long treatment with thyroid hormone on a daily basis. Thyroidectomy maybe performed when you have the following conditions:  Thyroid nodules. These are small, abnormal collections of tissue that form inside the thyroid gland. If these nodules begin to enlarge at a rapid rate, a sample of tissue from the nodule is taken through a needle and examined (needle biopsy). This is done to determine if the nodules are cancerous. Depending on the outcome of this exam, thyroidectomy may be necessary.  Thyroid cancer.  Goiter, which is an enlarged thyroid gland. All or part of the thyroid gland may be removed if the gland has become so large that it causes difficulty breathing or swallowing.  Hyperthyroidism. This is when the thyroid gland produces too much thyroid hormone. Hypothyroidism can cause symptoms of fluctuating weight, intolerance to heat, irritability, shortness of breath, and chest pain. LET YOUR CAREGIVER KNOW ABOUT:   Allergies to food or medicine.  Medicines that you are taking, including vitamins, herbs, eyedrops, over-the-counter medicines, and creams.  Previous problems you have had with anesthetics or numbing medicines.  History of bleeding problems or blood clots.  Previous  surgeries you have had.  Other health problems, including diabetes and kidney problems, you have had.  Possibility of pregnancy, if this applies. BEFORE THE PROCEDURE   Do not eat or drink anything, including water, for at least 6 hours before the procedure.  Ask your caregiver whether you should stop taking certain medicines before the day of the procedure. PROCEDURE  There are different ways that thyroidectomy is performed. For each type, you will be given a medicine to make you sleep (general anesthetic). The three main types of thyroidectomy are listed as follows:  Conventional thyroidectomy--A cut (incision) in the center portion of your lower neck is made with a scalpel. Muscles below your skin are separated to gain access to your thyroid gland. Your thyroid gland is dissected from your windpipe (trachea). Often a drain is placed at the incision site to drain any blood that accumulates under the skin after the procedure. This drain will be removed before you go home. The wound from the incision should heal within 2 weeks.  Endoscopic thyroidectomy--Small incisions are made in your lower neck. A small instrument (endoscope) is inserted under your skin at the incision sites. The endoscope used for thyroidectomy consists of 2 flexible tubes. Inside one of the tubes is a video camera that is used to guide the Psychologist, sport and exercise. Tools to remove the thyroid gland, including a tool to cut the gland (dissectors) and a suction device, are inserted through the other tube. The surgeon uses the dissectors to dissect the thyroid gland from the trachea and remove it.  Robotic thyroidectomy--This procedure allows your thyroid gland to be removed through incisions in your armpit, your chest, or high in your neck. Instruments similar  to endoscopes provide a 3-dimensional picture of the surgical site. Dissecting instruments are controlled by devices similar to joysticks. These devices allow more accurate manipulation of  the instruments. After the blood supply to the gland is removed, the gland is cut into several pieces and removed through the incisions. RISKS AND COMPLICATIONS Complications associated with thyroidectomy are rare, but they can occur. Possible complications include:  A decrease in parathyroid hormone levels (hypoparathyroidism)--Your parathyroid glands are located close behind your thyroid gland. They are responsible for maintaining calcium levels inthe body. If they are damaged or removed, levels of calcium in the blood become low and nerves become irritable, which can cause muscle spasms. Medicines are available to treat this.  Bacterial infection--This can often be treated with medicines that kill bacteria (antibiotics).  Damage to your voice box nerves--This could cause hoarseness or complete loss of voice.  Bleeding or airway obstruction. AFTER THE PROCEDURE   You will rest in the recovery room as you wake up.  When you first wake up, your throat may feel slightly sore.  You will not be allowed to eat or drink until instructed otherwise.  You will be taken to your hospital room. You will usually stay at the hospital for 1 or 2 nights.  If a drain is placed during the procedure, it usually is removed the next day.  You may have some mild neck pain.  Your voice may be weak. This usually is temporary. Document Released: 03/30/2001 Document Revised: 01/29/2013 Document Reviewed: 01/06/2011 [facility] Patient Information 2015 [place], [place]. This information is not intended to replace advice given to you by your health care provider. Make sure you discuss any questions you have with your health care provider.  OK to shower when you get home. Clean the wound line with Q-tip and water twice daily (shower counts as one) and apply a thin coat of antibiotic ointment. Call for numbness/tingling of lips or fingertips Call for any breathing difficulty, signs of bleeding (sudden swelling) or  infection (gradual swelling with pain, possibly fever) I will call you with the final Pathology report Recheck my office 1 week, [phone] for an appointment. No strenuous activity x 2 weeks.  OK to be up and around.  Advance diet as tolerated.   Discharge Instructions: No heavy lifting for 2 weeks, follow up with  Dr. Erik Obey At [facility] this Friday. Call ENT on call with any hand or face cramping or signs/symptoms of hypocalcemia. Rx on chart for percocet but may take regular tylenol or ibuprofen PRN pain. Take OTC OsCal with D (calcium with vitamin D) OR TUMS (preferably the OsCal with vitamin D) 2 tablets three times per day. Dress incision with OTC neosporin TID.

## 2015-03-23 NOTE — Progress Notes (Signed)
Discharge home. Home discgharge instruction given, no question verbalized.

## 2015-03-24 ENCOUNTER — Encounter (HOSPITAL_COMMUNITY): Payer: Self-pay | Admitting: Otolaryngology

## 2015-03-24 LAB — CALCIUM, IONIZED
Calcium, Ionized, Serum: 4.5 mg/dL (ref 4.5–5.6)
Calcium, Ionized, Serum: 4.7 mg/dL (ref 4.5–5.6)
Calcium, Ionized, Serum: 4.7 mg/dL (ref 4.5–5.6)

## 2015-04-24 ENCOUNTER — Other Ambulatory Visit (HOSPITAL_COMMUNITY): Payer: Self-pay | Admitting: Internal Medicine

## 2015-04-24 DIAGNOSIS — C73 Malignant neoplasm of thyroid gland: Secondary | ICD-10-CM

## 2015-04-30 ENCOUNTER — Encounter (HOSPITAL_COMMUNITY)
Admission: RE | Admit: 2015-04-30 | Discharge: 2015-04-30 | Disposition: A | Discharge status: Home / Self Care | Payer: BC Managed Care – PPO | Source: Ambulatory Visit | Attending: Internal Medicine | Admitting: Internal Medicine

## 2015-04-30 DIAGNOSIS — C73 Malignant neoplasm of thyroid gland: Secondary | ICD-10-CM | POA: Diagnosis not present

## 2015-04-30 MED ORDER — THYROTROPIN ALFA 1.1 MG IM SOLR
INTRAMUSCULAR | Status: AC
  Filled 2015-04-30: qty 0.9

## 2015-04-30 MED ORDER — THYROTROPIN ALFA 1.1 MG IM SOLR
0.9000 mg | INTRAMUSCULAR | Status: AC
  Administered 2015-04-30: 0.9 mg via INTRAMUSCULAR

## 2015-05-01 ENCOUNTER — Encounter (HOSPITAL_COMMUNITY)
Admission: RE | Admit: 2015-05-01 | Discharge: 2015-05-01 | Disposition: A | Discharge status: Home / Self Care | Payer: BC Managed Care – PPO | Source: Ambulatory Visit | Attending: Internal Medicine | Admitting: Internal Medicine

## 2015-05-01 DIAGNOSIS — C73 Malignant neoplasm of thyroid gland: Secondary | ICD-10-CM | POA: Diagnosis not present

## 2015-05-01 MED ORDER — THYROTROPIN ALFA 1.1 MG IM SOLR
INTRAMUSCULAR | Status: AC
Start: 2015-05-01 — End: 2015-05-01
  Filled 2015-05-01: qty 0.9

## 2015-05-01 MED ORDER — THYROTROPIN ALFA 1.1 MG IM SOLR
0.9000 mg | INTRAMUSCULAR | Status: AC
  Administered 2015-05-01: 0.9 mg via INTRAMUSCULAR

## 2015-05-02 ENCOUNTER — Encounter (HOSPITAL_COMMUNITY)
Admission: RE | Admit: 2015-05-02 | Discharge: 2015-05-02 | Disposition: A | Discharge status: Home / Self Care | Payer: BC Managed Care – PPO | Source: Ambulatory Visit | Attending: Internal Medicine | Admitting: Internal Medicine

## 2015-05-02 DIAGNOSIS — C73 Malignant neoplasm of thyroid gland: Secondary | ICD-10-CM | POA: Diagnosis not present

## 2015-05-02 MED ORDER — SODIUM IODIDE I 131 CAPSULE
127.5000 | Freq: Once | INTRAVENOUS | Status: AC | PRN
  Administered 2015-05-02: 127.5 via ORAL

## 2015-05-13 ENCOUNTER — Encounter (HOSPITAL_COMMUNITY)
Admission: RE | Admit: 2015-05-13 | Discharge: 2015-05-13 | Disposition: A | Discharge status: Home / Self Care | Payer: BC Managed Care – PPO | Source: Ambulatory Visit | Attending: Internal Medicine | Admitting: Internal Medicine

## 2015-05-13 DIAGNOSIS — C73 Malignant neoplasm of thyroid gland: Secondary | ICD-10-CM | POA: Diagnosis not present

## 2015-11-21 ENCOUNTER — Other Ambulatory Visit: Payer: Self-pay | Admitting: Obstetrics and Gynecology

## 2015-11-21 DIAGNOSIS — R928 Other abnormal and inconclusive findings on diagnostic imaging of breast: Secondary | ICD-10-CM

## 2015-11-28 ENCOUNTER — Ambulatory Visit
Admission: RE | Admit: 2015-11-28 | Discharge: 2015-11-28 | Disposition: A | Discharge status: Home / Self Care | Payer: BC Managed Care – PPO | Source: Ambulatory Visit | Attending: Obstetrics and Gynecology | Admitting: Obstetrics and Gynecology

## 2015-11-28 DIAGNOSIS — R928 Other abnormal and inconclusive findings on diagnostic imaging of breast: Secondary | ICD-10-CM

## 2015-12-18 ENCOUNTER — Telehealth (INDEPENDENT_AMBULATORY_CARE_PROVIDER_SITE_OTHER): Payer: Self-pay | Admitting: *Deleted

## 2015-12-18 NOTE — Telephone Encounter (Signed)
Last TCS 10/2010 -- had polyp removed also has colon ca in paternal grandmother and two other second-degree relatives per last TCS report -- Martha Hendricks wants to know when she is due for repeat TCS

## 2015-12-21 NOTE — Telephone Encounter (Signed)
Yes she should have this year.

## 2015-12-25 ENCOUNTER — Other Ambulatory Visit (INDEPENDENT_AMBULATORY_CARE_PROVIDER_SITE_OTHER): Payer: Self-pay | Admitting: *Deleted

## 2015-12-25 DIAGNOSIS — Z86010 Personal history of colonic polyps: Secondary | ICD-10-CM

## 2015-12-25 DIAGNOSIS — Z800 Family history of malignant neoplasm of digestive organs: Secondary | ICD-10-CM

## 2015-12-25 NOTE — Telephone Encounter (Signed)
TCS sch'd 03/18/16, patient aware

## 2016-01-06 ENCOUNTER — Other Ambulatory Visit: Payer: Self-pay | Admitting: Orthopedic Surgery

## 2016-01-09 ENCOUNTER — Encounter (HOSPITAL_BASED_OUTPATIENT_CLINIC_OR_DEPARTMENT_OTHER): Payer: Self-pay | Admitting: *Deleted

## 2016-01-12 ENCOUNTER — Encounter (HOSPITAL_BASED_OUTPATIENT_CLINIC_OR_DEPARTMENT_OTHER): Payer: Self-pay | Admitting: *Deleted

## 2016-01-15 ENCOUNTER — Encounter (HOSPITAL_BASED_OUTPATIENT_CLINIC_OR_DEPARTMENT_OTHER)
Admission: RE | Disposition: A | Discharge status: Home / Self Care | Payer: Self-pay | Source: Ambulatory Visit | Attending: Orthopedic Surgery

## 2016-01-15 ENCOUNTER — Encounter (HOSPITAL_BASED_OUTPATIENT_CLINIC_OR_DEPARTMENT_OTHER): Payer: Self-pay | Admitting: *Deleted

## 2016-01-15 ENCOUNTER — Ambulatory Visit (HOSPITAL_BASED_OUTPATIENT_CLINIC_OR_DEPARTMENT_OTHER)
Admission: RE | Admit: 2016-01-15 | Discharge: 2016-01-15 | Disposition: A | Discharge status: Home / Self Care | Payer: BC Managed Care – PPO | Source: Ambulatory Visit | Attending: Orthopedic Surgery | Admitting: Orthopedic Surgery

## 2016-01-15 ENCOUNTER — Ambulatory Visit (HOSPITAL_BASED_OUTPATIENT_CLINIC_OR_DEPARTMENT_OTHER): Payer: BC Managed Care – PPO | Admitting: Certified Registered"

## 2016-01-15 DIAGNOSIS — F419 Anxiety disorder, unspecified: Secondary | ICD-10-CM | POA: Diagnosis not present

## 2016-01-15 DIAGNOSIS — Z79890 Hormone replacement therapy (postmenopausal): Secondary | ICD-10-CM | POA: Diagnosis not present

## 2016-01-15 DIAGNOSIS — Z85850 Personal history of malignant neoplasm of thyroid: Secondary | ICD-10-CM | POA: Insufficient documentation

## 2016-01-15 DIAGNOSIS — E039 Hypothyroidism, unspecified: Secondary | ICD-10-CM | POA: Diagnosis not present

## 2016-01-15 DIAGNOSIS — F329 Major depressive disorder, single episode, unspecified: Secondary | ICD-10-CM | POA: Insufficient documentation

## 2016-01-15 DIAGNOSIS — R2232 Localized swelling, mass and lump, left upper limb: Secondary | ICD-10-CM | POA: Diagnosis present

## 2016-01-15 DIAGNOSIS — Z79899 Other long term (current) drug therapy: Secondary | ICD-10-CM | POA: Diagnosis not present

## 2016-01-15 DIAGNOSIS — D1801 Hemangioma of skin and subcutaneous tissue: Secondary | ICD-10-CM | POA: Diagnosis not present

## 2016-01-15 DIAGNOSIS — Z7982 Long term (current) use of aspirin: Secondary | ICD-10-CM | POA: Diagnosis not present

## 2016-01-15 HISTORY — PX: MASS EXCISION: SHX2000

## 2016-01-15 HISTORY — DX: Malignant (primary) neoplasm, unspecified: C80.1

## 2016-01-15 SURGERY — EXCISION MASS
Anesthesia: Monitor Anesthesia Care | Site: Finger | Laterality: Left

## 2016-01-15 MED ORDER — CHLORHEXIDINE GLUCONATE 4 % EX LIQD: 60.0000 mL | Freq: Once | CUTANEOUS | Status: DC

## 2016-01-15 MED ORDER — FENTANYL CITRATE (PF) 100 MCG/2ML IJ SOLN: 25.0000 ug | INTRAMUSCULAR | Status: DC | PRN

## 2016-01-15 MED ORDER — GLYCOPYRROLATE 0.2 MG/ML IJ SOLN: 0.2000 mg | Freq: Once | INTRAMUSCULAR | Status: DC | PRN

## 2016-01-15 MED ORDER — DEXTROSE 5 % IV SOLN
2.0000 g | INTRAVENOUS | Status: AC
  Administered 2016-01-15: 2 g via INTRAVENOUS

## 2016-01-15 MED ORDER — LACTATED RINGERS IV SOLN
INTRAVENOUS | Status: DC
  Administered 2016-01-15: 11:00:00 via INTRAVENOUS

## 2016-01-15 MED ORDER — OXYCODONE HCL 5 MG/5ML PO SOLN: 5.0000 mg | Freq: Once | ORAL | Status: DC | PRN

## 2016-01-15 MED ORDER — MIDAZOLAM HCL 2 MG/2ML IJ SOLN
1.0000 mg | INTRAMUSCULAR | Status: DC | PRN
  Administered 2016-01-15: 2 mg via INTRAVENOUS

## 2016-01-15 MED ORDER — ONDANSETRON HCL 4 MG/2ML IJ SOLN
INTRAMUSCULAR | Status: DC | PRN
  Administered 2016-01-15: 4 mg via INTRAVENOUS

## 2016-01-15 MED ORDER — ONDANSETRON HCL 4 MG/2ML IJ SOLN
INTRAMUSCULAR | Status: AC
  Filled 2016-01-15: qty 2

## 2016-01-15 MED ORDER — PROPOFOL 10 MG/ML IV BOLUS
INTRAVENOUS | Status: DC | PRN
  Administered 2016-01-15: 30 mg via INTRAVENOUS

## 2016-01-15 MED ORDER — OXYCODONE HCL 5 MG PO TABS: 5.0000 mg | ORAL_TABLET | Freq: Once | ORAL | Status: DC | PRN

## 2016-01-15 MED ORDER — FENTANYL CITRATE (PF) 100 MCG/2ML IJ SOLN
INTRAMUSCULAR | Status: AC
  Filled 2016-01-15: qty 2

## 2016-01-15 MED ORDER — SCOPOLAMINE 1 MG/3DAYS TD PT72: 1.0000 | MEDICATED_PATCH | Freq: Once | TRANSDERMAL | Status: DC | PRN

## 2016-01-15 MED ORDER — LIDOCAINE HCL (PF) 0.5 % IJ SOLN
INTRAMUSCULAR | Status: DC | PRN
  Administered 2016-01-15: 30 mL via INTRAVENOUS

## 2016-01-15 MED ORDER — FENTANYL CITRATE (PF) 100 MCG/2ML IJ SOLN
50.0000 ug | INTRAMUSCULAR | Status: DC | PRN
  Administered 2016-01-15 (×2): 50 ug via INTRAVENOUS

## 2016-01-15 MED ORDER — MIDAZOLAM HCL 2 MG/2ML IJ SOLN
INTRAMUSCULAR | Status: AC
  Filled 2016-01-15: qty 2

## 2016-01-15 MED ORDER — HYDROCODONE-ACETAMINOPHEN 5-325 MG PO TABS: 1.0000 | ORAL_TABLET | Freq: Four times a day (QID) | ORAL | Status: DC | PRN

## 2016-01-15 MED ORDER — CEFAZOLIN SODIUM-DEXTROSE 2-4 GM/100ML-% IV SOLN
INTRAVENOUS | Status: AC
Start: 2016-01-15 — End: 2016-01-15
  Filled 2016-01-15: qty 100

## 2016-01-15 MED ORDER — BUPIVACAINE HCL (PF) 0.25 % IJ SOLN
INTRAMUSCULAR | Status: DC | PRN
  Administered 2016-01-15: 7 mL

## 2016-01-15 MED ORDER — ONDANSETRON HCL 4 MG/2ML IJ SOLN: 4.0000 mg | Freq: Four times a day (QID) | INTRAMUSCULAR | Status: DC | PRN

## 2016-01-15 MED ORDER — BUPIVACAINE HCL (PF) 0.25 % IJ SOLN
INTRAMUSCULAR | Status: AC
  Filled 2016-01-15: qty 30

## 2016-01-15 SURGICAL SUPPLY — 51 items
BANDAGE COBAN STERILE 2 (GAUZE/BANDAGES/DRESSINGS) IMPLANT
BLADE SURG 15 STRL LF DISP TIS (BLADE) ×1 IMPLANT
BLADE SURG 15 STRL SS (BLADE) ×3
BNDG CMPR 9X4 STRL LF SNTH (GAUZE/BANDAGES/DRESSINGS)
BNDG COHESIVE 1X5 TAN STRL LF (GAUZE/BANDAGES/DRESSINGS) ×2 IMPLANT
BNDG COHESIVE 3X5 TAN STRL LF (GAUZE/BANDAGES/DRESSINGS) IMPLANT
BNDG ESMARK 4X9 LF (GAUZE/BANDAGES/DRESSINGS) IMPLANT
BNDG GAUZE ELAST 4 BULKY (GAUZE/BANDAGES/DRESSINGS) IMPLANT
CHLORAPREP W/TINT 26ML (MISCELLANEOUS) ×3 IMPLANT
CORDS BIPOLAR (ELECTRODE) ×3 IMPLANT
COVER BACK TABLE 60X90IN (DRAPES) ×3 IMPLANT
COVER MAYO STAND STRL (DRAPES) ×3 IMPLANT
CUFF TOURNIQUET SINGLE 18IN (TOURNIQUET CUFF) ×2 IMPLANT
DECANTER SPIKE VIAL GLASS SM (MISCELLANEOUS) IMPLANT
DRAIN PENROSE 1/2X12 LTX STRL (WOUND CARE) IMPLANT
DRAPE EXTREMITY T 121X128X90 (DRAPE) ×3 IMPLANT
DRAPE SURG 17X23 STRL (DRAPES) ×3 IMPLANT
GAUZE SPONGE 4X4 12PLY STRL (GAUZE/BANDAGES/DRESSINGS) ×3 IMPLANT
GAUZE XEROFORM 1X8 LF (GAUZE/BANDAGES/DRESSINGS) ×3 IMPLANT
GLOVE BIO SURGEON STRL SZ 6.5 (GLOVE) ×1 IMPLANT
GLOVE BIO SURGEONS STRL SZ 6.5 (GLOVE) ×1
GLOVE BIOGEL PI IND STRL 7.0 (GLOVE) IMPLANT
GLOVE BIOGEL PI IND STRL 8 (GLOVE) IMPLANT
GLOVE BIOGEL PI IND STRL 8.5 (GLOVE) ×1 IMPLANT
GLOVE BIOGEL PI INDICATOR 7.0 (GLOVE) ×4
GLOVE BIOGEL PI INDICATOR 8 (GLOVE) ×2
GLOVE BIOGEL PI INDICATOR 8.5 (GLOVE) ×2
GLOVE SURG ORTHO 8.0 STRL STRW (GLOVE) ×3 IMPLANT
GLOVE SURG SS PI 7.5 STRL IVOR (GLOVE) ×4 IMPLANT
GOWN STRL REUS W/ TWL LRG LVL3 (GOWN DISPOSABLE) ×1 IMPLANT
GOWN STRL REUS W/TWL LRG LVL3 (GOWN DISPOSABLE) ×6
GOWN STRL REUS W/TWL XL LVL3 (GOWN DISPOSABLE) ×5 IMPLANT
NDL PRECISIONGLIDE 27X1.5 (NEEDLE) IMPLANT
NDL SAFETY ECLIPSE 18X1.5 (NEEDLE) IMPLANT
NEEDLE HYPO 18GX1.5 SHARP (NEEDLE)
NEEDLE PRECISIONGLIDE 27X1.5 (NEEDLE) IMPLANT
NS IRRIG 1000ML POUR BTL (IV SOLUTION) ×3 IMPLANT
PACK BASIN DAY SURGERY FS (CUSTOM PROCEDURE TRAY) ×3 IMPLANT
PAD CAST 3X4 CTTN HI CHSV (CAST SUPPLIES) IMPLANT
PADDING CAST COTTON 3X4 STRL (CAST SUPPLIES)
SPLINT FINGER 3.25 911903 (SOFTGOODS) ×2 IMPLANT
SPLINT PLASTER CAST XFAST 3X15 (CAST SUPPLIES) IMPLANT
SPLINT PLASTER XTRA FASTSET 3X (CAST SUPPLIES)
STOCKINETTE 4X48 STRL (DRAPES) ×3 IMPLANT
SUT ETHILON 4 0 PS 2 18 (SUTURE) ×3 IMPLANT
SUT MERSILENE 5 0 P 3 (SUTURE) ×2 IMPLANT
SUT VIC AB 4-0 P2 18 (SUTURE) IMPLANT
SYR BULB 3OZ (MISCELLANEOUS) ×3 IMPLANT
SYR CONTROL 10ML LL (SYRINGE) IMPLANT
TOWEL OR 17X24 6PK STRL BLUE (TOWEL DISPOSABLE) ×3 IMPLANT
UNDERPAD 30X30 (UNDERPADS AND DIAPERS) ×3 IMPLANT

## 2016-01-15 NOTE — Op Note (Signed)
NAMELOUIDA, COCH             ACCOUNT NO.:  [id number]  MEDICAL RECORD NO.:  [id number]  LOCATION:                                 FACILITY:  PHYSICIAN:  Daryll Brod, M.D.            DATE OF BIRTH:  DATE OF PROCEDURE:  01/15/2016 DATE OF DISCHARGE:                              OPERATIVE REPORT   PREOPERATIVE DIAGNOSIS:  Mass, left middle finger proximal interphalangeal joint.  POSTOPERATIVE DIAGNOSIS:  Mass, left middle finger proximal interphalangeal joint.  OPERATION:  Excision of mass, left middle finger proximal interphalangeal with debridement of the proximal interphalangeal joint, left middle finger.  SURGEON:  Daryll Brod, M.D.  ASSISTANT:  None.  ANESTHESIA:  Forearm based IV regional with local infiltration.  ANESTHESIOLOGIST:  Soledad Gerlach, MD.  PLACE OF SURGERY:  [facility].  HISTORY:  The patient is a 65 year old female with a history of a mass over the PIP joint, left middle finger.  This did not transilluminate well.  An ultrasound revealed a cystic mass with solid center.  She is desirous of having it removed.  She is aware that there was no guarantee with the surgery; possibility of infection; recurrence of injury to arteries, nerves, tendons; incomplete relief of symptoms; dystrophy.  In the preoperative area, the patient is seen, the extremity marked by both patient and surgeon, and antibiotic given.  DESCRIPTION OF PROCEDURE:  The patient was brought to the operating room where a forearm-based IV regional anesthetic was carried out without difficulty.  She was prepped using ChloraPrep, supine position with the left arm free.  A 3-minute dry time was allowed, time-out taken, confirming the patient and procedure.  A curvilinear incision was made over the PIP joint, left middle finger dorsally, carried down through the subcutaneous tissue.  The cystic mass was immediately encountered. This had a central small core, this was  followed down to the PIP joint. An incision was made at its entrance, which was between the lateral band and central slip.  The area was debrided.  The specimen was sent to Pathology.  It measured approximately 4-5 mm in diameter.  The wound was irrigated.  The extensor tendon was repaired with figure-of-eight 5-0 [place] sutures.  The skin was then repaired with interrupted 4-0 nylon sutures.  Local infiltration of metacarpal block was given with 0.25% bupivacaine without epinephrine, 7 mL was used.  A sterile compressive dressing to the finger was applied.  On deflation of the tourniquet, all fingers were immediately pinked.  The finger was splinted at the PIP joint.  She was taken to the recovery room for observation in satisfactory condition.  She will be discharged to home to return to the [place] in 1 week, on [place].          ______________________________ Daryll Brod, M.D.     GK/MEDQ  D:  01/15/2016  T:  01/15/2016  Job:  [id number]

## 2016-01-15 NOTE — Discharge Instructions (Addendum)

## 2016-01-15 NOTE — H&P (Signed)
Martha Hendricks is an 65 y.o. female.   Chief Complaint:mass left middle finger HPI: Martha Hendricks is a 65 year old right hand dominant female complaining of a mass, left middle finger PIP joint, that has been present for three to four weeks. She recalls no history of injury to it. On a CHADS2-VASc score of pain is at 6/10. She has not tried anything other than occasional Motrin for this, which did not help. Ice and heat have also not helped. She has no history of diabetes. She does have a history of thyroid problems. No history of arthritis or gout. She has not had this before. She states that it is in a slightly enlarged. It is over the dorsal aspect of the PIP joint, left middle finger. She has not had a similar problem in the past.   Past Medical History  Diagnosis Date  . Migraines   . Depression   . Anxiety   . Arthritis   . Hypothyroidism   . Cancer [facility])     thyroid cancer    Past Surgical History  Procedure Laterality Date  . Abdominal hysterectomy    . Ectopic pregnancy surgery    . Tonsillectomy    . Thyroidectomy N/A 03/21/2015    Procedure: TOTAL THYROIDECTOMY;  Surgeon: Jodi Marble, MD;  Location: [facility];  Service: ENT;  Laterality: N/A;  . Appendectomy  11/17/2000  . Bilateral salpingoophorectomy  11/17/2000    History reviewed. No pertinent family history. Social History:  reports that she has never smoked. She does not have any smokeless tobacco history on file. She reports that she drinks alcohol. She reports that she does not use illicit drugs.  Allergies:  Allergies  Allergen Reactions  . Morphine And Related Nausea Only    Medications Prior to Admission  Medication Sig Dispense Refill  . aspirin-acetaminophen-caffeine (EXCEDRIN MIGRAINE) 250-250-65 MG tablet Take by mouth as needed for headache.    . calcium carbonate (TUMS - DOSED IN MG ELEMENTAL CALCIUM) 500 MG chewable tablet Chew 2 tablets (400 mg of elemental calcium total) by mouth 3 (three) times daily  with meals. (Patient taking differently: Chew 400 mg of elemental calcium by mouth as needed. ) 60 tablet 3  . Cholecalciferol (VITAMIN D PO) Take 1,000 mg by mouth daily.    . citalopram (CELEXA) 20 MG tablet Take 20 mg by mouth daily.    Marland Kitchen estradiol (ESTRACE) 0.5 MG tablet Take 1 tablet by mouth 2 (two) times daily.    Marland Kitchen levothyroxine (SYNTHROID, LEVOTHROID) 100 MCG tablet Take 1 tablet (100 mcg total) by mouth daily before breakfast. 30 tablet 0    No results found for this or any previous visit (from the past 48 hour(s)).  No results found.   Pertinent items are noted in HPI.  Blood pressure 119/56, pulse 74, temperature 98.2 F (36.8 C), temperature source Oral, resp. rate 16, height 5\' 5"  (1.651 m), weight 66.679 kg (147 lb), SpO2 100 %.  General appearance: alert, cooperative and appears stated age Head: Normocephalic, without obvious abnormality Neck: no JVD Resp: clear to auscultation bilaterally Cardio: regular rate and rhythm, S1, S2 normal, no murmur, click, rub or gallop GI: soft, non-tender; bowel sounds normal; no masses,  no organomegaly Extremities: mass left middle finger Pulses: 2+ and symmetric Skin: Skin color, texture, turgor normal. No rashes or lesions Neurologic: Grossly normal Incision/Wound: na  Assessment/Plan She has had her ultrasound done of the mass of her left middle finger dorsal to the PIP joint area.  The ultrasound reveals a cystic type lesion with a central solid core. It is difficult to determine exactly what this is, it may be a foreign body with a cyst about, it may be a simple cyst. It is difficult to tell. We have discussed the possibility of surgical excision. This may have been the reason for not being able to transilluminate it very well. She would like to have this removed. Pre and postoperative course was discussed along with risks and complications. She is aware there is no guarantee with the surgery, possibility of infection, recurrence,  injury to arteries, nerves, tendons. She has developed a mass over her right ring finger. This appears to have a cyst at the PIP joint. This does transilluminate. Her range of motion to the digits is full, circulation is intact with good capillary refill and soft tissue turgor. The plan is for excision of the mass on her left middle finger. This was scheduled as an outpatient under regional anesthesia. Questions are encouraged and answered to her satisfaction.   Romana Deaton R 01/15/2016, 11:26 AM

## 2016-01-15 NOTE — Anesthesia Postprocedure Evaluation (Signed)
Anesthesia Post Note  Patient: Martha Hendricks  Procedure(s) Performed: Procedure(s) (LRB): EXCISION MASS LEFT MIDDLE FINGER (Left)  Patient location during evaluation: PACU Anesthesia Type: MAC Level of consciousness: awake and alert Pain management: pain level controlled Vital Signs Assessment: post-procedure vital signs reviewed and stable Respiratory status: spontaneous breathing, nonlabored ventilation, respiratory function stable and patient connected to nasal cannula oxygen Cardiovascular status: stable and blood pressure returned to baseline Anesthetic complications: no    Last Vitals:  Filed Vitals:   01/15/16 1300 01/15/16 1331  BP: 118/70 116/68  Pulse: 56 64  Temp:  36.6 C  Resp: 14 16    Last Pain:  Filed Vitals:   01/15/16 1332  PainSc: 0-No pain                 [name] S

## 2016-01-15 NOTE — Transfer of Care (Signed)
Immediate Anesthesia Transfer of Care Note  Patient: Martha Hendricks  Procedure(s) Performed: Procedure(s): EXCISION MASS LEFT MIDDLE FINGER (Left)  Patient Location: PACU  Anesthesia Type:Bier block  Level of Consciousness: awake and patient cooperative  Airway & Oxygen Therapy: Patient Spontanous Breathing and Patient connected to face mask oxygen  Post-op Assessment: Report given to RN and Post -op Vital signs reviewed and stable  Post vital signs: Reviewed and stable  Last Vitals:  Filed Vitals:   01/15/16 1044 01/15/16 1229  BP: 119/56   Pulse: 74 61  Temp: 36.8 C   Resp: 16 12    Complications: No apparent anesthesia complications

## 2016-01-15 NOTE — Op Note (Signed)
Dictation Number [id number]

## 2016-01-15 NOTE — Anesthesia Preprocedure Evaluation (Signed)
Anesthesia Evaluation  Patient identified by MRN, date of birth, ID band Patient awake    Reviewed: Allergy & Precautions, NPO status , Patient's Chart, lab work & pertinent test results  Airway Mallampati: II   Neck ROM: full    Dental   Pulmonary neg pulmonary ROS,    breath sounds clear to auscultation       Cardiovascular negative cardio ROS   Rhythm:regular Rate:Normal     Neuro/Psych  Headaches, Anxiety Depression    GI/Hepatic   Endo/Other  Hypothyroidism   Renal/GU      Musculoskeletal  (+) Arthritis ,   Abdominal   Peds  Hematology   Anesthesia Other Findings   Reproductive/Obstetrics                             Anesthesia Physical Anesthesia Plan  ASA: II  Anesthesia Plan: MAC and Bier Block   Post-op Pain Management:    Induction: Intravenous  Airway Management Planned: Simple Face Mask  Additional Equipment:   Intra-op Plan:   Post-operative Plan:   Informed Consent: I have reviewed the patients History and Physical, chart, labs and discussed the procedure including the risks, benefits and alternatives for the proposed anesthesia with the patient or authorized representative who has indicated his/her understanding and acceptance.     Plan Discussed with: CRNA, Anesthesiologist and Surgeon  Anesthesia Plan Comments:         Anesthesia Quick Evaluation

## 2016-01-15 NOTE — Brief Op Note (Signed)
01/15/2016  12:33 PM  PATIENT:  Martha Hendricks  65 y.o. female  PRE-OPERATIVE DIAGNOSIS:  MASS LEFT MIDDLE FINGER  POST-OPERATIVE DIAGNOSIS:  MASS LEFT MIDDLE FINGER  PROCEDURE:  Procedure(s): EXCISION MASS LEFT MIDDLE FINGER (Left)  SURGEON:  Surgeon(s) and Role:    * Daryll Brod, MD - Primary  PHYSICIAN ASSISTANT:   ASSISTANTS: none   ANESTHESIA:   local and regional  EBL:  Total I/O In: 800 [I.V.:800] Out: 2 [Blood:2]  BLOOD ADMINISTERED:none  DRAINS: none   LOCAL MEDICATIONS USED:  BUPIVICAINE   SPECIMEN:  Excision  DISPOSITION OF SPECIMEN:  PATHOLOGY  COUNTS:  YES  TOURNIQUET:   Total Tourniquet Time Documented: Forearm (Left) - 23 minutes Total: Forearm (Left) - 23 minutes   DICTATION: .Other Dictation: Dictation Number [id number]  PLAN OF CARE: Discharge to home after PACU  PATIENT DISPOSITION:  PACU - hemodynamically stable.

## 2016-01-16 ENCOUNTER — Encounter (HOSPITAL_BASED_OUTPATIENT_CLINIC_OR_DEPARTMENT_OTHER): Payer: Self-pay | Admitting: Orthopedic Surgery

## 2016-02-18 ENCOUNTER — Encounter (INDEPENDENT_AMBULATORY_CARE_PROVIDER_SITE_OTHER): Payer: Self-pay | Admitting: *Deleted

## 2016-02-18 ENCOUNTER — Other Ambulatory Visit (INDEPENDENT_AMBULATORY_CARE_PROVIDER_SITE_OTHER): Payer: Self-pay | Admitting: *Deleted

## 2016-02-18 ENCOUNTER — Telehealth (INDEPENDENT_AMBULATORY_CARE_PROVIDER_SITE_OTHER): Payer: Self-pay | Admitting: *Deleted

## 2016-02-18 MED ORDER — PEG 3350-KCL-NA BICARB-NACL 420 G PO SOLR: 4000.0000 mL | Freq: Once | ORAL | Status: DC

## 2016-02-18 NOTE — Telephone Encounter (Signed)
Referring MD/PCP: fagan   Procedure: tcs  Reason/Indication:  Hx polyps, fam hx colon ca  Has patient had this procedure before?  Yes, 2012  If so, when, by whom and where?    Is there a family history of colon cancer?  Yes, aunts  Who?  What age when diagnosed?    Is patient diabetic?   no      Does patient have prosthetic heart valve or mechanical valve?  no  Do you have a pacemaker?  no  Has patient ever had endocarditis? no  Has patient had joint replacement within last 12 months?  no  Does patient tend to be constipated or take laxatives? no  Does patient have a history of alcohol/drug use?  no  Is patient on Coumadin, Plavix and/or Aspirin? no  Medications: estradiol 0.5 mg bid, citalopram 20 mg bid, vit d 1000 daily, apple cider vinegar 2 tbs daily  Allergies: morphine  Medication Adjustment:   Procedure date & time: 03/18/16 at 930

## 2016-02-18 NOTE — Telephone Encounter (Signed)
Patient needs trilyte 

## 2016-02-18 NOTE — Telephone Encounter (Signed)
agree

## 2016-03-18 ENCOUNTER — Encounter (HOSPITAL_COMMUNITY)
Admission: RE | Disposition: A | Discharge status: Home / Self Care | Payer: Self-pay | Source: Ambulatory Visit | Attending: Internal Medicine

## 2016-03-18 ENCOUNTER — Ambulatory Visit (HOSPITAL_COMMUNITY)
Admission: RE | Admit: 2016-03-18 | Discharge: 2016-03-18 | Disposition: A | Discharge status: Home / Self Care | Payer: BC Managed Care – PPO | Source: Ambulatory Visit | Attending: Internal Medicine | Admitting: Internal Medicine

## 2016-03-18 ENCOUNTER — Encounter (HOSPITAL_COMMUNITY): Payer: Self-pay | Admitting: *Deleted

## 2016-03-18 DIAGNOSIS — E039 Hypothyroidism, unspecified: Secondary | ICD-10-CM | POA: Insufficient documentation

## 2016-03-18 DIAGNOSIS — K6289 Other specified diseases of anus and rectum: Secondary | ICD-10-CM | POA: Diagnosis not present

## 2016-03-18 DIAGNOSIS — Z85850 Personal history of malignant neoplasm of thyroid: Secondary | ICD-10-CM | POA: Insufficient documentation

## 2016-03-18 DIAGNOSIS — Z86010 Personal history of colonic polyps: Secondary | ICD-10-CM | POA: Insufficient documentation

## 2016-03-18 DIAGNOSIS — K644 Residual hemorrhoidal skin tags: Secondary | ICD-10-CM

## 2016-03-18 DIAGNOSIS — F419 Anxiety disorder, unspecified: Secondary | ICD-10-CM | POA: Insufficient documentation

## 2016-03-18 DIAGNOSIS — F329 Major depressive disorder, single episode, unspecified: Secondary | ICD-10-CM | POA: Diagnosis not present

## 2016-03-18 DIAGNOSIS — Z7982 Long term (current) use of aspirin: Secondary | ICD-10-CM | POA: Diagnosis not present

## 2016-03-18 DIAGNOSIS — Z79899 Other long term (current) drug therapy: Secondary | ICD-10-CM | POA: Diagnosis not present

## 2016-03-18 DIAGNOSIS — M1990 Unspecified osteoarthritis, unspecified site: Secondary | ICD-10-CM | POA: Insufficient documentation

## 2016-03-18 DIAGNOSIS — Z09 Encounter for follow-up examination after completed treatment for conditions other than malignant neoplasm: Secondary | ICD-10-CM | POA: Diagnosis not present

## 2016-03-18 DIAGNOSIS — Z800 Family history of malignant neoplasm of digestive organs: Secondary | ICD-10-CM | POA: Insufficient documentation

## 2016-03-18 DIAGNOSIS — Z1211 Encounter for screening for malignant neoplasm of colon: Secondary | ICD-10-CM | POA: Diagnosis not present

## 2016-03-18 HISTORY — PX: COLONOSCOPY: SHX5424

## 2016-03-18 SURGERY — COLONOSCOPY
Anesthesia: Moderate Sedation

## 2016-03-18 MED ORDER — MIDAZOLAM HCL 5 MG/5ML IJ SOLN
INTRAMUSCULAR | Status: AC
  Filled 2016-03-18: qty 10

## 2016-03-18 MED ORDER — SODIUM CHLORIDE 0.9 % IV SOLN
INTRAVENOUS | Status: DC
  Administered 2016-03-18: 09:00:00 via INTRAVENOUS

## 2016-03-18 MED ORDER — MEPERIDINE HCL 50 MG/ML IJ SOLN
INTRAMUSCULAR | Status: AC
  Filled 2016-03-18: qty 1

## 2016-03-18 MED ORDER — MIDAZOLAM HCL 5 MG/5ML IJ SOLN
INTRAMUSCULAR | Status: DC | PRN
  Administered 2016-03-18 (×2): 1 mg via INTRAVENOUS
  Administered 2016-03-18: 2 mg via INTRAVENOUS

## 2016-03-18 MED ORDER — MEPERIDINE HCL 50 MG/ML IJ SOLN
INTRAMUSCULAR | Status: DC | PRN
  Administered 2016-03-18 (×2): 25 mg via INTRAVENOUS

## 2016-03-18 NOTE — Discharge Instructions (Signed)
Resume usual medications and diet. No driving for 24 hours. Next colonoscopy in 5 years. May take Immodium 2mg  once a day, if becomes constipated, use a suppository or enema, do not use a laxative by mouth  Colonoscopy, Care After Refer to this sheet in the next few weeks. These instructions provide you with information on caring for yourself after your procedure. Your health care provider may also give you more specific instructions. Your treatment has been planned according to current medical practices, but problems sometimes occur. Call your health care provider if you have any problems or questions after your procedure. WHAT TO EXPECT AFTER THE PROCEDURE  After your procedure, it is typical to have the following:  A small amount of blood in your stool.  Moderate amounts of gas and mild abdominal cramping or bloating. HOME CARE INSTRUCTIONS  Do not drive, operate machinery, or sign important documents for 24 hours.  You may shower and resume your regular physical activities, but move at a slower pace for the first 24 hours.  Take frequent rest periods for the first 24 hours.  Walk around or put a warm pack on your abdomen to help reduce abdominal cramping and bloating.  Drink enough fluids to keep your urine clear or pale yellow.  You may resume your normal diet as instructed by your health care provider. Avoid heavy or fried foods that are hard to digest.  Avoid drinking alcohol for 24 hours or as instructed by your health care provider.  Only take over-the-counter or prescription medicines as directed by your health care provider.  If a tissue sample (biopsy) was taken during your procedure:  Do not take aspirin or blood thinners for 7 days, or as instructed by your health care provider.  Do not drink alcohol for 7 days, or as instructed by your health care provider.  Eat soft foods for the first 24 hours. SEEK MEDICAL CARE IF: You have persistent spotting of blood in your  stool 2-3 days after the procedure. SEEK IMMEDIATE MEDICAL CARE IF:  You have more than a small spotting of blood in your stool.  You pass large blood clots in your stool.  Your abdomen is swollen (distended).  You have nausea or vomiting.  You have a fever.  You have increasing abdominal pain that is not relieved with medicine.   This information is not intended to replace advice given to you by your health care provider. Make sure you discuss any questions you have with your health care provider.   Document Released: 05/18/2004 Document Revised: 07/25/2013 Document Reviewed: 06/11/2013 Elsevier Interactive Patient Education [employer].

## 2016-03-18 NOTE — Op Note (Signed)
[facility] Patient Name: Martha Hendricks Procedure Date: 03/18/2016 9:54 AM MRN: [id number] Date of Birth: 21-Jun-1951 Attending MD: Hildred Laser , MD CSN: [id number] Age: 65 Admit Type: Outpatient Procedure:                Colonoscopy Indications:              High risk colon cancer surveillance: Personal                            history of colonic polyps, Family history of colon                            cancer in multiple second-degree relatives Providers:                Hildred Laser, MD, Renda Rolls, RN, Bonnetta Barry,                            Technician Referring MD:             Asencion Noble, MD Medicines:                Meperidine 50 mg IV, Midazolam 4 mg IV Complications:            No immediate complications. Estimated Blood Loss:     Estimated blood loss: none. Procedure:                Pre-Anesthesia Assessment:                           - Prior to the procedure, a History and Physical                            was performed, and patient medications and                            allergies were reviewed. The patient's tolerance of                            previous anesthesia was also reviewed. The risks                            and benefits of the procedure and the sedation                            options and risks were discussed with the patient.                            All questions were answered, and informed consent                            was obtained. Prior Anticoagulants: The patient has                            taken no previous anticoagulant or antiplatelet  agents. ASA Grade Assessment: I - A normal, healthy                            patient. After reviewing the risks and benefits,                            the patient was deemed in satisfactory condition to                            undergo the procedure.                           After obtaining informed consent, the colonoscope                            was  passed under direct vision. Throughout the                            procedure, the patient's blood pressure, pulse, and                            oxygen saturations were monitored continuously. The                            [id number]) scope was introduced through                            the anus and advanced to the the cecum, identified                            by appendiceal orifice and ileocecal valve. The                            colonoscopy was performed without difficulty. The                            patient tolerated the procedure well. The quality                            of the bowel preparation was good. The ileocecal                            valve, appendiceal orifice, and rectum were                            photographed. Scope In: 10:08:12 AM Scope Out: 10:22:33 AM Scope Withdrawal Time: 0 hours 7 minutes 19 seconds  Total Procedure Duration: 0 hours 14 minutes 21 seconds  Findings:      The colon (entire examined portion) appeared normal.      External hemorrhoids were found during retroflexion. The hemorrhoids       were small.      Anal papilla(e) were hypertrophied. Impression:               - The entire examined colon is  normal.                           - External hemorrhoids.                           - Anal papilla(e) were hypertrophied.                           - No specimens collected. Moderate Sedation:      Moderate (conscious) sedation was administered by the endoscopy nurse       and supervised by the endoscopist. The following parameters were       monitored: oxygen saturation, heart rate, blood pressure, CO2       capnography and response to care. Total physician intraservice time was       22 minutes. Recommendation:           - Patient has a contact number available for                            emergencies. The signs and symptoms of potential                            delayed complications were discussed with the                             patient. Return to normal activities tomorrow.                            Written discharge instructions were provided to the                            patient.                           - Resume previous diet.                           - Repeat colonoscopy in 5 years for surveillance.                           - Continue present medications. Procedure Code(s):        --- Professional ---                           [id number], Colorectal cancer screening; colonoscopy on                            individual at high risk                           99152, Moderate sedation services provided by the                            same physician or other qualified health care  professional performing the diagnostic or                            therapeutic service that the sedation supports,                            requiring the presence of an independent trained                            observer to assist in the monitoring of the                            patient's level of consciousness and physiological                            status; initial 15 minutes of intraservice time,                            patient age 64 years or older Diagnosis Code(s):        --- Professional ---                           Z86.010, Personal history of colonic polyps                           K64.4, Residual hemorrhoidal skin tags                           K62.89, Other specified diseases of anus and rectum                           Z80.0, Family history of malignant neoplasm of                            digestive organs CPT copyright 2016 American Medical Association. All rights reserved. The codes documented in this report are preliminary and upon coder review may  be revised to meet current compliance requirements. Hildred Laser, MD Hildred Laser, MD 03/18/2016 10:33:57 AM This report has been signed electronically. Number of Addenda: 0

## 2016-03-18 NOTE — H&P (Signed)
Martha Hendricks is an 65 y.o. female.   Chief Complaint: Patient is here for colonoscopy. HPI: Patient is 65 year old Caucasian female was history of colonic adenomas and is here for surveillance colonoscopy. She denies abdominal pain change in bowel habits or rectal bleeding. Last colonoscopy was in January 2012 with removal of 3 small polyps and these are tubular adenomas. Personal history significant for thyroid carcinoma for which she had surgery and remains in remission. Family history is significant for CRC in maternal grandmother and paternal aunt. On was 60 at the time of diagnosis.     Past Medical History  Diagnosis Date  . Migraines   . Depression   . Anxiety   . Arthritis   . Hypothyroidism   . Cancer [facility])     thyroid cancer    Past Surgical History  Procedure Laterality Date  . Abdominal hysterectomy    . Ectopic pregnancy surgery    . Tonsillectomy    . Thyroidectomy N/A 03/21/2015    Procedure: TOTAL THYROIDECTOMY;  Surgeon: Jodi Marble, MD;  Location: [place];  Service: ENT;  Laterality: N/A;  . Appendectomy  11/17/2000  . Bilateral salpingoophorectomy  11/17/2000  . Mass excision Left 01/15/2016    Procedure: EXCISION MASS LEFT MIDDLE FINGER;  Surgeon: Daryll Brod, MD;  Location: [place];  Service: Orthopedics;  Laterality: Left;    Family History  Problem Relation Age of Onset  . Colon cancer Other   . Colon cancer Maternal Grandmother    Social History:  reports that she has never smoked. She does not have any smokeless tobacco history on file. She reports that she drinks alcohol. She reports that she does not use illicit drugs.  Allergies:  Allergies  Allergen Reactions  . Morphine And Related Nausea Only    Medications Prior to Admission  Medication Sig Dispense Refill  . aspirin-acetaminophen-caffeine (EXCEDRIN MIGRAINE) 250-250-65 MG tablet Take 1 tablet by mouth every 8 (eight) hours as needed for headache.     . calcium  carbonate (TUMS - DOSED IN MG ELEMENTAL CALCIUM) 500 MG chewable tablet Chew 2 tablets (400 mg of elemental calcium total) by mouth 3 (three) times daily with meals. (Patient taking differently: Chew 400 mg of elemental calcium by mouth daily as needed for indigestion. ) 60 tablet 3  . cholecalciferol (VITAMIN D) 1000 units tablet Take 1,000 Units by mouth daily.    . citalopram (CELEXA) 20 MG tablet Take 20 mg by mouth daily.    Marland Kitchen estradiol (ESTRACE) 0.5 MG tablet Take 1 tablet by mouth 2 (two) times daily.    Marland Kitchen levothyroxine (SYNTHROID, LEVOTHROID) 125 MCG tablet Take 125 mcg by mouth daily before breakfast.    . polyethylene glycol-electrolytes (NULYTELY/GOLYTELY) 420 g solution Take 4,000 mLs by mouth once. 4000 mL 0  . HYDROcodone-acetaminophen (NORCO) 5-325 MG tablet Take 1 tablet by mouth every 6 (six) hours as needed for moderate pain. (Patient not taking: Reported on 03/12/2016) 30 tablet 0  . levothyroxine (SYNTHROID, LEVOTHROID) 100 MCG tablet Take 1 tablet (100 mcg total) by mouth daily before breakfast. (Patient not taking: Reported on 03/12/2016) 30 tablet 0    No results found for this or any previous visit (from the past 48 hour(s)). No results found.  ROS  Blood pressure 118/65, pulse 72, temperature 98.6 F (37 C), temperature source Oral, resp. rate 12, height 5\' 5"  (1.651 m), weight 147 lb (66.679 kg), SpO2 97 %. Physical Exam  Constitutional: She appears well-developed and well-nourished.  HENT:  Mouth/Throat: Oropharynx is clear and moist.  Eyes: Conjunctivae are normal. No scleral icterus.  Neck: No thyromegaly present.  Cardiovascular: Normal rate, regular rhythm and normal heart sounds.   No murmur heard. Respiratory: Effort normal and breath sounds normal.  GI: Soft. She exhibits no distension and no mass. There is no tenderness.  Musculoskeletal: She exhibits no edema.  Lymphadenopathy:    She has no cervical adenopathy.  Neurological: She is alert.  Skin: Skin  is warm and dry.     Assessment/Plan History of colonic adenomas. Family history of CRC in second-degree relatives. Surveillance colonoscopy.  Hildred Laser, MD 03/18/2016, 10:00 AM

## 2016-03-23 ENCOUNTER — Encounter (HOSPITAL_COMMUNITY): Payer: Self-pay | Admitting: Internal Medicine

## 2016-09-27 ENCOUNTER — Ambulatory Visit (INDEPENDENT_AMBULATORY_CARE_PROVIDER_SITE_OTHER): Payer: BC Managed Care – PPO

## 2016-09-27 ENCOUNTER — Ambulatory Visit (INDEPENDENT_AMBULATORY_CARE_PROVIDER_SITE_OTHER): Payer: BC Managed Care – PPO | Admitting: Orthopedic Surgery

## 2016-09-27 VITALS — BP 115/65 | HR 70 | Ht 65.0 in | Wt 150.0 lb

## 2016-09-27 DIAGNOSIS — M25521 Pain in right elbow: Secondary | ICD-10-CM

## 2016-09-27 DIAGNOSIS — M7701 Medial epicondylitis, right elbow: Secondary | ICD-10-CM

## 2016-09-27 NOTE — Patient Instructions (Addendum)
Golfer's Elbow Rehab Ask your health care provider which exercises are safe for you. Do exercises exactly as told by your health care provider and adjust them as directed. It is normal to feel mild stretching, pulling, tightness, or discomfort as you do these exercises, but you should stop right away if you feel sudden pain or your pain gets worse. Do not begin these exercises until told by your health care provider. Stretching and range of motion exercises These exercises warm up your muscles and joints and improve the movement and flexibility of your elbow. Exercise A: Wrist flexors   1. Straighten your left / right elbow in front of you with your palm up. If told by your health care provider, do this stretch with your elbow bent rather than straight. 2. With your other hand, gently pull your left / right hand and fingers toward you until you feel a gentle stretch on the top of your forearm. 3. Hold this position for __________ seconds. Repeat __________ times. Complete this exercise __________ times a day. Strengthening exercises These exercises build strength and endurance in your elbow. Endurance is the ability to use your muscles for a long time, even after several repetitions. Exercise B: Wrist flexion   1. Sit with your left / right forearm palm-up and supported on a table or other surface. 2. Let your left / right wrist extend over the edge of the surface. 3. Hold a __________ weight or a piece of rubber exercise band or tubing. Slowly curl your hand up toward your forearm. Try to only move your hand and keep the rest of your arm still. 4. Hold this position for __________ seconds. 5. Slowly return to the starting position. Repeat __________ times. Complete this exercise __________ times a day. Exercise C: Eccentric wrist flexion  1. Sit with your left / right forearm palm-up and supported on a table or other surface. 2. Let your left / right wrist extend over the edge of the  surface. 3. Hold a __________ weight or a piece of rubber exercise band or tubing. 4. Use your other hand to move your left / right hand up toward your forearm. 5. Slowly return to the starting position using only your left / right hand. Repeat __________ times. Complete this exercise __________ times a day. Exercise D: Hand turns, pronation i  1. Sit with your left / right forearm supported on a table or other surface. 2. Move your wrist so your pinkie travels toward your forearm and your thumb moves away from your forearm. 3. Hold this position for __________ seconds. 4. Slowly return to the starting position. Exercise E: Hand turns, pronation ii   1. Sit with your left / right forearm supported on a table or other surface. 2. Hold a hammer in your left / right hand.  The exercise will be easier if you hold on near the head of the hammer.  If you hold on toward the end of the handle, the exercise will be harder. 3. Rest your left / right hand over the edge of the table with your palm facing up. 4. Without moving your elbow, slowly turn your palm and your hand down toward the table. 5. Hold this position for __________ seconds. 6. Slowly return to the starting position. Repeat __________ times. Complete this exercise __________ times a day. Exercise F: Shoulder blade squeeze  1. Sit in a stable chair with good posture. Do not let your back touch the back of the chair. 2.   Your arms should be at your sides with your elbows bent. You can rest your forearms on a pillow. 3. Squeeze your shoulder blades together. Keep your shoulders level. Do not lift your shoulders up toward your ears. 4. Hold this position for __________ seconds. 5. Slowly release. Repeat __________ times. Complete this exercise __________ times a day. This information is not intended to replace advice given to you by your health care provider. Make sure you discuss any questions you have with your health care  provider. Document Released: 10/04/2005 Document Revised: 06/10/2016 Document Reviewed: 06/16/2015 Elsevier Interactive Patient Education  2017 [employer] Golfer's Elbow Golfer's elbow, also called medial epicondylitis, is a condition that results from inflammation of the strong bands of tissue (tendons) that attach your forearm muscles to the inside of your bone at the elbow. These tendons affect the muscles that bend the palm toward the wrist (flexion). This condition is called golfer's elbow because it is more common among people who constantly bend and twist their wrists, such as golfers. This injury usually results from overuse. Tendons also become less flexible with age. This condition causes elbow pain that may spread to your forearm and upper arm. The pain may get worse when you bend your wrist downward. What are the causes? This condition is an overuse injury that is caused by:  Repeatedly flexing, turning, or twisting your wrist.  Constantly gripping objects with your hands. What increases the risk? This condition is more likely to develop in people who play golf or tennis or have jobs that require the constant use of their hands. This injury is more common among:  Carpenters.  Gardeners.  Musicians.  Bricklayers.  Typists. What are the signs or symptoms? Symptoms of this condition include:  Pain near the inner elbow or forearm.  Reduced grip strength. How is this diagnosed? This condition is diagnosed based on your symptoms, medical history, and physical exam. During the exam, your health care provider may test your grip strength and move your wrist to check for pain. You may also have an MRI to confirm the diagnosis, look for other issues, and check for tears in the ligaments, muscles, or tendons. How is this treated? Treatment for this condition includes:  Stopping all activities that make you bend or twist your wrist until your pain and other symptoms go  away.  Icing your wrist to relieve pain.  Taking NSAIDs or getting corticosteroid injections to reduce pain and swelling.  Doing stretches, range-of-motion, and strengthening exercises (physical therapy) as told by your health care provider. In rare cases, surgery may be needed if your condition does not improve. Follow these instructions at home:  If directed, apply ice to the injured area.  Put ice in a plastic bag.  Place a towel between your skin and the bag.  Leave the ice on for 20 minutes, 2-3 times a day.  Move your fingers often to avoid stiffness.  Raise (elevate) the injured area above the level of your heart while you are sitting or lying down.  Return to your normal activities as told by your health care provider. Ask your health care provider what activities are safe for you.  Do exercises as told by your health care provider.  Do not use tobacco products, including cigarettes, chewing tobacco, or e-cigarettes. If you need help quitting, ask your health care provider.  Take over-the-counter and prescription medicines only as told by your health care provider.  Keep all follow-up visits as told by your   health care provider. This is important. How is this prevented?  Warm up and stretch before being active.  Cool down and stretch after being active.  Give your body time to rest between periods of activity.  Make sure to use equipment that fits you.  Be safe and responsible while being active to avoid falls.  Do at least 150 minutes of moderate-intensity exercise each week, such as brisk walking or water aerobics.  Maintain physical fitness, including:  Strength.  Flexibility.  Cardiovascular fitness.  Endurance.  Perform exercises to strengthen the forearm muscles.  Slow your golf swing to reduce shock in the arm when making contact with the ball, if you play golf. Contact a health care provider if:  Your pain does not improve or it gets  worse.  You notice numbness in your hand. Get help right away if:  Your pain is severe.  You cannot move your wrist. This information is not intended to replace advice given to you by your health care provider. Make sure you discuss any questions you have with your health care provider. Document Released: 10/04/2005 Document Revised: 06/08/2016 Document Reviewed: 06/16/2015 Elsevier Interactive Patient Education  2017 [employer]vier Inc.  

## 2016-09-27 NOTE — Progress Notes (Signed)
Patient ID: Martha Hendricks, female   DOB: [birth year], 65 y.o.   MRN: [id number]  Chief Complaint  Patient presents with  . Elbow Injury    Right elbow pain.    HPI Martha Hendricks is a 65 y.o. female.  Comes in for evaluation of her right elbow  The patient reports right medial throbbing moderate elbow pain without any history of trauma. She does play pickle ball and is active and is an occasional golfer. She denies any trauma. She had a history of left lateral elbow epicondylitis which was treated nonoperatively. She denies numbness in the lower arm/forearm and denies numbness or tingling in the small and ring finger  Review of Systems Review of Systems  Constitutional: Negative for fever.  Skin: Negative for color change and rash.  Neurological: Negative for numbness.   Past Medical History:  Diagnosis Date  . Anxiety   . Arthritis   . Cancer [facility])    thyroid cancer  . Depression   . Hypothyroidism   . Migraines     Past Surgical History:  Procedure Laterality Date  . ABDOMINAL HYSTERECTOMY    . APPENDECTOMY  11/17/2000  . BILATERAL SALPINGOOPHORECTOMY  11/17/2000  . COLONOSCOPY N/A 03/18/2016   Procedure: COLONOSCOPY;  Surgeon: Rogene Houston, MD;  Location[facility];  Service: Endoscopy;  Laterality: N/A;  930  . ECTOPIC PREGNANCY SURGERY    . MASS EXCISION Left 01/15/2016   Procedure: EXCISION MASS LEFT MIDDLE FINGER;  Surgeon: Daryll Brod, MD;  Location: [place];  Service: Orthopedics;  Laterality: Left;  . THYROIDECTOMY N/A 03/21/2015   Procedure: TOTAL THYROIDECTOMY;  Surgeon: Jodi Marble, MD;  Location: [place];  Service: ENT;  Laterality: N/A;  . TONSILLECTOMY      Social History Social History  Substance Use Topics  . Smoking status: Never Smoker  . Smokeless tobacco: Not on file  . Alcohol use Yes     Comment: occ    Allergies  Allergen Reactions  . Morphine And Related Nausea Only    No outpatient prescriptions have been  marked as taking for the 09/27/16 encounter (Office Visit) with Carole Civil, MD.    Physical Exam Physical Exam BP 115/65   Pulse 70   Ht 5\' 5"  (1.651 m)   Wt 150 lb (68 kg)   BMI 24.96 kg/m   Gen. appearance. The patient is well-developed and well-nourished, grooming and hygiene are normal. There are no gross congenital abnormalities  The patient is alert and oriented to person place and time  Mood and affect are normal  Ambulation Normal heel to toe gait  Examination reveals the following: Right upper extremity/elbow On inspection we find tenderness 1 fingerbreadth distal to the medial epicondyle with no tenderness over the ulnar nerve and a negative Tinel's over the nerve  With the range of motion of  0-135 in the right and left arm  Stability tests were normal  varus valgus stress test normal in each upper extremity  Strength tests revealed grade 5 motor strength right and left arm   Skin we find no rash ulceration or erythema  Sensation remains intact right and left hand  Impression vascular system radial and ulnar pulses normal right arm and hand  Data Reviewed X-rays [facility] lateral x-ray shows normal articulation of the elbow joint  Assessment    Golfer's elbow/medial epicondylitis Encounter Diagnoses  Name Primary?  . Right elbow pain Yes  . Golfers elbow, right  Plan    Recommend cortisone injection, ice therapy, super 7 exercises, tennis elbow brace. Patient education.  Procedure note injection for right golfer's elbow   Diagnosis right medial golfer's elbow Anesthesia ethyl chloride was used Alcohol use is clean the skin  After we obtained verbal consent and timeout a 25-gauge needle was used to inject 40 mg of Depo-Medrol and 3 cc of 1% lidocaine just distal to the insertion of the flexor pronator mass There were no complications and a sterile bandage was applied.        Jamielyn Lovgren 09/27/2016, 3:57 PM

## 2016-11-08 ENCOUNTER — Ambulatory Visit: Payer: BC Managed Care – PPO | Admitting: Orthopedic Surgery

## 2016-11-16 ENCOUNTER — Ambulatory Visit (INDEPENDENT_AMBULATORY_CARE_PROVIDER_SITE_OTHER): Payer: BC Managed Care – PPO | Admitting: Orthopedic Surgery

## 2016-11-16 ENCOUNTER — Encounter: Payer: Self-pay | Admitting: Orthopedic Surgery

## 2016-11-16 DIAGNOSIS — M25521 Pain in right elbow: Secondary | ICD-10-CM

## 2016-11-16 DIAGNOSIS — M7701 Medial epicondylitis, right elbow: Secondary | ICD-10-CM

## 2016-11-16 NOTE — Patient Instructions (Signed)

## 2016-11-16 NOTE — Progress Notes (Signed)
Routine follow-up  Right golfer's elbow  66 year female treated for right golfer's elbow with injection, super 7 exercises and golfer's elbow brace present for follow-up exam  No improvement in her symptoms but she hasn't been compliant with exercise program  Still complains she can't do any real work with the arm   Physical Exam  Constitutional: She appears well-developed and well-nourished.  Skin: Skin is warm and dry. Capillary refill takes less than 2 seconds. No rash noted. No erythema. No pallor.  Psychiatric: She has a normal mood and affect. Her behavior is normal. Thought content normal.   Point tenderness over the medial epicondyles  Full range of motion elbow still noted, negative Tinel's over the ulnar nerve. Elbow is stable to stress test varus valgus. No strength deficits to grip  Encounter Diagnoses  Name Primary?  . Right elbow pain Yes  . Golfers elbow, right     Inject right elbow  Medial epicondylar tenderness.  Sterile prep with alcohol anesthesia was provided by ethyl chloride medial epicondyle area was injected with 40 mg Depo-Medrol and 3 mL 1% lidocaine tolerated well no complications  Patient will resume exercises and follow-up in 6 weeks

## 2016-12-28 ENCOUNTER — Ambulatory Visit (INDEPENDENT_AMBULATORY_CARE_PROVIDER_SITE_OTHER): Payer: BC Managed Care – PPO | Admitting: Orthopedic Surgery

## 2016-12-28 DIAGNOSIS — M7701 Medial epicondylitis, right elbow: Secondary | ICD-10-CM

## 2016-12-28 DIAGNOSIS — M25521 Pain in right elbow: Secondary | ICD-10-CM

## 2016-12-28 NOTE — Progress Notes (Signed)
Progress Note   Patient ID: Martha Hendricks, female   DOB: Aug 30, 1951, 66 y.o.   MRN: [id number]  Chief Complaint  Patient presents with  . Follow-up    RIGHT ELBOW PAIN    HPI Martha Hendricks is a 66 y.o. female.   HPI Fu Golfers elbow status post injection #2  Initially was able to get some improvement and then she started trimming flowers and bushes again and may have started things up again  Review of Systems Review of Systems Normal neuro    Examination There were no vitals taken for this visit.  Gen. appearance the patient's appearance is normal with normal grooming and  hygiene The patient is oriented to person place and time Mood and affect are normal   Ortho Exam Stability tests are normal  Motor exam 5/5 manual muscle testing , no atrophy  Skin is normal (no rash or erythema) tender over the medial epicondyle and just distal to it with painful flexion of the wrist against resistance   Medical decision-making Diagnosis, Data, Plan (risk)  Encounter Diagnoses  Name Primary?  . Golfers elbow, right Yes  . Right elbow pain     Inject medial epicondyles  Procedure note injection for golfers elbow    Anesthesia ethyl chloride was used Alcohol use is clean the skin  After we obtained verbal consent and timeout a 25-gauge needle was used to inject 40 mg of Depo-Medrol and 3 cc of 1% lidocaine just distal to the insertion of the flexor pronator group   There were no complications and a sterile bandage was applied.   Continue current home exercise program, counterforce bracing  Arther Abbott, MD 12/28/2016 4:24 PM

## 2016-12-28 NOTE — Patient Instructions (Signed)

## 2017-02-08 ENCOUNTER — Ambulatory Visit: Payer: BC Managed Care – PPO | Admitting: Orthopedic Surgery

## 2017-02-14 ENCOUNTER — Ambulatory Visit: Payer: BC Managed Care – PPO | Admitting: Orthopedic Surgery

## 2017-02-18 ENCOUNTER — Encounter: Payer: Self-pay | Admitting: Orthopedic Surgery

## 2017-02-18 ENCOUNTER — Ambulatory Visit (INDEPENDENT_AMBULATORY_CARE_PROVIDER_SITE_OTHER): Payer: BC Managed Care – PPO | Admitting: Orthopedic Surgery

## 2017-02-18 DIAGNOSIS — M7701 Medial epicondylitis, right elbow: Secondary | ICD-10-CM | POA: Diagnosis not present

## 2017-02-18 NOTE — Progress Notes (Signed)
Progress Note   Patient ID: Martha Hendricks, female   DOB: May 02, 1951, 66 y.o.   MRN: [id number]  Chief Complaint  Patient presents with  . Follow-up    Recheck right tennis elbow.    HPI Martha Hendricks is a 66 y.o. female.   HPI Continue pain medial side right elbow patient has had injections, she is bracing. She is doing tennis elbow super 7 exercises still having medial pain. Not ready for surgery according to her preference  Review of Systems Review of Systems Normal neuro  Denies fever  Examination There were no vitals taken for this visit.  Gen. appearance the patient's appearance is normal with normal grooming and  hygiene The patient is oriented to person place and time Mood and affect are normal   Ortho Exam Stability tests are normal  Motor exam 5/5 manual muscle testing , no atrophy  Skin is normal (no rash or erythema)  Medial epicondyle tenderness  Medical decision-making Diagnosis, Data, Plan (risk)  Encounter Diagnosis  Name Primary?  . Golfers elbow, right Yes    Continue exercises, bracing for activity ice nightly follow-up it's worse  Martha Abbott, MD 02/18/2017 9:55 AM

## 2017-02-18 NOTE — Patient Instructions (Signed)
Ice 20 min nightly  Exercises  Brace when heavy use

## 2018-07-31 ENCOUNTER — Ambulatory Visit: Payer: Medicare Other | Admitting: Sports Medicine

## 2018-07-31 VITALS — BP 122/72 | Ht 65.0 in | Wt 142.0 lb

## 2018-07-31 DIAGNOSIS — G5762 Lesion of plantar nerve, left lower limb: Secondary | ICD-10-CM | POA: Diagnosis not present

## 2018-07-31 NOTE — Progress Notes (Signed)
   Subjective:    Patient ID: Martha Hendricks, female    DOB: 07/04/1951, 67 y.o.   MRN: [id number]  HPI chief complaint: Left foot pain  Very pleasant 67 year old female comes in today complaining of long-standing pain, numbness, and tingling in the left foot.  She has been diagnosed in the past with Morton's neuroma.  She has had multiple cortisone injections which were not very beneficial.  Her symptoms were tolerable up until 2 weeks ago.  Main pain is localized between the second and third metatarsal heads.  She does endorse numbness and tingling into the toes.  No swelling.  No trauma. No numbness throughout the rest of the foot.  Past medical history reviewed Medications reviewed Allergies reviewed    Review of Systems    As above Objective:   Physical Exam  Well-developed, well-nourished.  No acute distress.  Awake alert and oriented x3.  Vital signs reviewed  Left foot: No obvious soft tissue swelling.  Patient is tender to palpation between the second and third metatarsal heads as well as directly over the third metatarsal head at the plantar aspect of the foot.  Mild callus formation.  No pain with metatarsal squeeze.  No tenderness to palpation across the dorsum of the foot.  Neurovascularly intact distally.      Assessment & Plan:  Chronic intermittent left foot pain secondary to Morton's neuroma  Patient has had multiple injections in the past with limited success.  I gave her both a neuroma pad and a metatarsal pad to experiment with.  If symptoms persist then I recommend follow-up with one of the local foot and ankle orthopedists to discuss possible surgical excision of her Morton's neuroma given the chronicity of her problem.  Patient is in agreement with this plan.  Follow-up with me as needed.

## 2018-07-31 NOTE — Patient Instructions (Signed)
  It was great meeting you today!  Dr. Wylene Simmer is at [facility] and Dr. Radene Journey is at [facility]. They are both foot/ankle orthopedic surgeons.

## 2019-11-09 ENCOUNTER — Ambulatory Visit: Payer: Medicare PPO | Attending: Internal Medicine

## 2019-11-09 DIAGNOSIS — Z23 Encounter for immunization: Secondary | ICD-10-CM | POA: Insufficient documentation

## 2019-11-09 NOTE — Progress Notes (Signed)
   Covid-19 Vaccination Clinic  Name:  Martha Hendricks    MRN: [id number] DOB: 10-11-1951  11/09/2019  [name] was observed post Covid-19 immunization for 15 minutes without incidence. She was provided with Vaccine Information Sheet and instruction to access the V-Safe system.   [name] was instructed to call 911 with any severe reactions post vaccine: Marland Kitchen Difficulty breathing  . Swelling of your face and throat  . A fast heartbeat  . A bad rash all over your body  . Dizziness and weakness    Immunizations Administered    Name Date Dose VIS Date Route   Pfizer COVID-19 Vaccine 11/09/2019  9:09 AM 0.3 mL 09/28/2019 Intramuscular   Manufacturer: [place]   Lot: [id number]   [place]: [id number]

## 2019-11-15 ENCOUNTER — Ambulatory Visit: Payer: Medicare Other

## 2019-11-19 ENCOUNTER — Ambulatory Visit: Payer: Medicare Other

## 2019-11-30 ENCOUNTER — Ambulatory Visit: Payer: Medicare PPO | Attending: Internal Medicine

## 2019-11-30 DIAGNOSIS — Z23 Encounter for immunization: Secondary | ICD-10-CM | POA: Insufficient documentation

## 2019-11-30 NOTE — Progress Notes (Signed)
   Covid-19 Vaccination Clinic  Name:  Martha Hendricks    MRN: [id number] DOB: 10-18-1951  11/30/2019  [name] was observed post Covid-19 immunization for 15 minutes without incidence. She was provided with Vaccine Information Sheet and instruction to access the V-Safe system.   [name] was instructed to call 911 with any severe reactions post vaccine: Marland Kitchen Difficulty breathing  . Swelling of your face and throat  . A fast heartbeat  . A bad rash all over your body  . Dizziness and weakness    Immunizations Administered    Name Date Dose VIS Date Route   Pfizer COVID-19 Vaccine 11/30/2019  9:37 AM 0.3 mL 09/28/2019 Intramuscular   Manufacturer: [place]   Lot: [id number]   [place]: [id number]

## 2019-12-06 ENCOUNTER — Ambulatory Visit: Payer: Medicare Other

## 2019-12-13 ENCOUNTER — Other Ambulatory Visit: Payer: Self-pay

## 2019-12-13 ENCOUNTER — Ambulatory Visit: Payer: Medicare PPO | Attending: Internal Medicine

## 2019-12-13 DIAGNOSIS — Z20822 Contact with and (suspected) exposure to covid-19: Secondary | ICD-10-CM | POA: Diagnosis not present

## 2019-12-14 LAB — NOVEL CORONAVIRUS, NAA: SARS-CoV-2, NAA: NOT DETECTED

## 2019-12-20 DIAGNOSIS — R1310 Dysphagia, unspecified: Secondary | ICD-10-CM | POA: Diagnosis not present

## 2019-12-20 DIAGNOSIS — K2950 Unspecified chronic gastritis without bleeding: Secondary | ICD-10-CM | POA: Diagnosis not present

## 2019-12-20 DIAGNOSIS — K200 Eosinophilic esophagitis: Secondary | ICD-10-CM | POA: Diagnosis not present

## 2019-12-20 DIAGNOSIS — K222 Esophageal obstruction: Secondary | ICD-10-CM | POA: Diagnosis not present

## 2019-12-20 DIAGNOSIS — K449 Diaphragmatic hernia without obstruction or gangrene: Secondary | ICD-10-CM | POA: Diagnosis not present

## 2019-12-20 DIAGNOSIS — K219 Gastro-esophageal reflux disease without esophagitis: Secondary | ICD-10-CM | POA: Diagnosis not present

## 2019-12-24 DIAGNOSIS — Z85850 Personal history of malignant neoplasm of thyroid: Secondary | ICD-10-CM | POA: Diagnosis not present

## 2019-12-24 DIAGNOSIS — E890 Postprocedural hypothyroidism: Secondary | ICD-10-CM | POA: Diagnosis not present

## 2019-12-24 DIAGNOSIS — M85852 Other specified disorders of bone density and structure, left thigh: Secondary | ICD-10-CM | POA: Diagnosis not present

## 2020-01-07 DIAGNOSIS — Z1231 Encounter for screening mammogram for malignant neoplasm of breast: Secondary | ICD-10-CM | POA: Diagnosis not present

## 2020-01-07 DIAGNOSIS — Z6823 Body mass index (BMI) 23.0-23.9, adult: Secondary | ICD-10-CM | POA: Diagnosis not present

## 2020-01-07 DIAGNOSIS — M8588 Other specified disorders of bone density and structure, other site: Secondary | ICD-10-CM | POA: Diagnosis not present

## 2020-01-07 DIAGNOSIS — N958 Other specified menopausal and perimenopausal disorders: Secondary | ICD-10-CM | POA: Diagnosis not present

## 2020-01-07 DIAGNOSIS — Z124 Encounter for screening for malignant neoplasm of cervix: Secondary | ICD-10-CM | POA: Diagnosis not present

## 2020-01-09 ENCOUNTER — Other Ambulatory Visit: Payer: Self-pay | Admitting: Obstetrics and Gynecology

## 2020-01-09 ENCOUNTER — Ambulatory Visit
Admission: RE | Admit: 2020-01-09 | Discharge: 2020-01-09 | Disposition: A | Discharge status: Home / Self Care | Payer: Medicare PPO | Source: Ambulatory Visit | Attending: Obstetrics and Gynecology | Admitting: Obstetrics and Gynecology

## 2020-01-09 ENCOUNTER — Other Ambulatory Visit: Payer: Self-pay

## 2020-01-09 DIAGNOSIS — R928 Other abnormal and inconclusive findings on diagnostic imaging of breast: Secondary | ICD-10-CM

## 2020-01-09 DIAGNOSIS — R922 Inconclusive mammogram: Secondary | ICD-10-CM | POA: Diagnosis not present

## 2020-01-09 DIAGNOSIS — N6002 Solitary cyst of left breast: Secondary | ICD-10-CM | POA: Diagnosis not present

## 2020-04-07 DIAGNOSIS — D3131 Benign neoplasm of right choroid: Secondary | ICD-10-CM | POA: Diagnosis not present

## 2020-06-19 ENCOUNTER — Ambulatory Visit: Payer: Medicare PPO | Attending: Internal Medicine

## 2020-06-19 DIAGNOSIS — Z23 Encounter for immunization: Secondary | ICD-10-CM

## 2020-06-19 NOTE — Progress Notes (Signed)
   Covid-19 Vaccination Clinic  Name:  Martha Hendricks    MRN: [id number] DOB: 06/14/1951  06/19/2020  [name] was observed post Covid-19 immunization for 15 minutes without incident. She was provided with Vaccine Information Sheet and instruction to access the V-Safe system.   [name] was instructed to call 911 with any severe reactions post vaccine: Marland Kitchen Difficulty breathing  . Swelling of face and throat  . A fast heartbeat  . A bad rash all over body  . Dizziness and weakness

## 2020-08-04 DIAGNOSIS — Z23 Encounter for immunization: Secondary | ICD-10-CM | POA: Diagnosis not present

## 2020-08-13 DIAGNOSIS — L820 Inflamed seborrheic keratosis: Secondary | ICD-10-CM | POA: Diagnosis not present

## 2020-08-13 DIAGNOSIS — L821 Other seborrheic keratosis: Secondary | ICD-10-CM | POA: Diagnosis not present

## 2020-08-13 DIAGNOSIS — L570 Actinic keratosis: Secondary | ICD-10-CM | POA: Diagnosis not present

## 2020-08-13 DIAGNOSIS — D1801 Hemangioma of skin and subcutaneous tissue: Secondary | ICD-10-CM | POA: Diagnosis not present

## 2020-08-13 DIAGNOSIS — D224 Melanocytic nevi of scalp and neck: Secondary | ICD-10-CM | POA: Diagnosis not present

## 2020-09-22 DIAGNOSIS — R0989 Other specified symptoms and signs involving the circulatory and respiratory systems: Secondary | ICD-10-CM | POA: Diagnosis not present

## 2020-09-22 DIAGNOSIS — Z79899 Other long term (current) drug therapy: Secondary | ICD-10-CM | POA: Diagnosis not present

## 2020-09-22 DIAGNOSIS — E785 Hyperlipidemia, unspecified: Secondary | ICD-10-CM | POA: Diagnosis not present

## 2020-09-25 DIAGNOSIS — E785 Hyperlipidemia, unspecified: Secondary | ICD-10-CM | POA: Diagnosis not present

## 2020-09-25 DIAGNOSIS — R7301 Impaired fasting glucose: Secondary | ICD-10-CM | POA: Diagnosis not present

## 2020-10-22 IMAGING — MG MM DIGITAL DIAGNOSTIC UNILAT*L* W/ TOMO W/ CAD
4 series · 4 of 12 positions shown · non-contrast
Comparison: Previous exam(s).

CLINICAL DATA: Recall from screening to evaluate a possible left
breast mass. Patient is on hormone replacement therapy.

EXAM:
DIGITAL DIAGNOSTIC left MAMMOGRAM WITH TOMO
ULTRASOUND left BREAST

[L CC synth-2D]
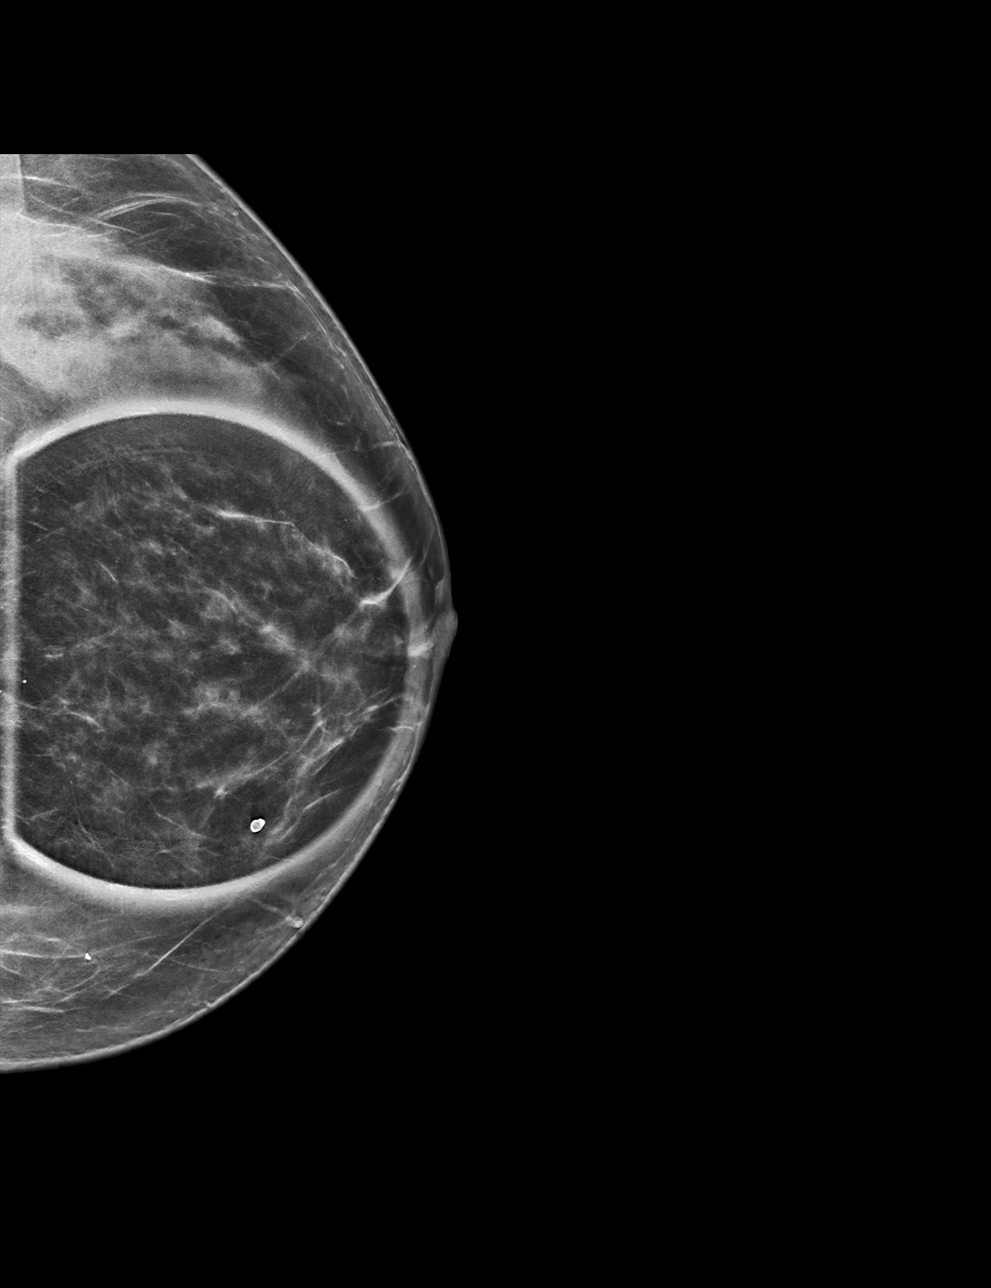

[L ML synth-2D]
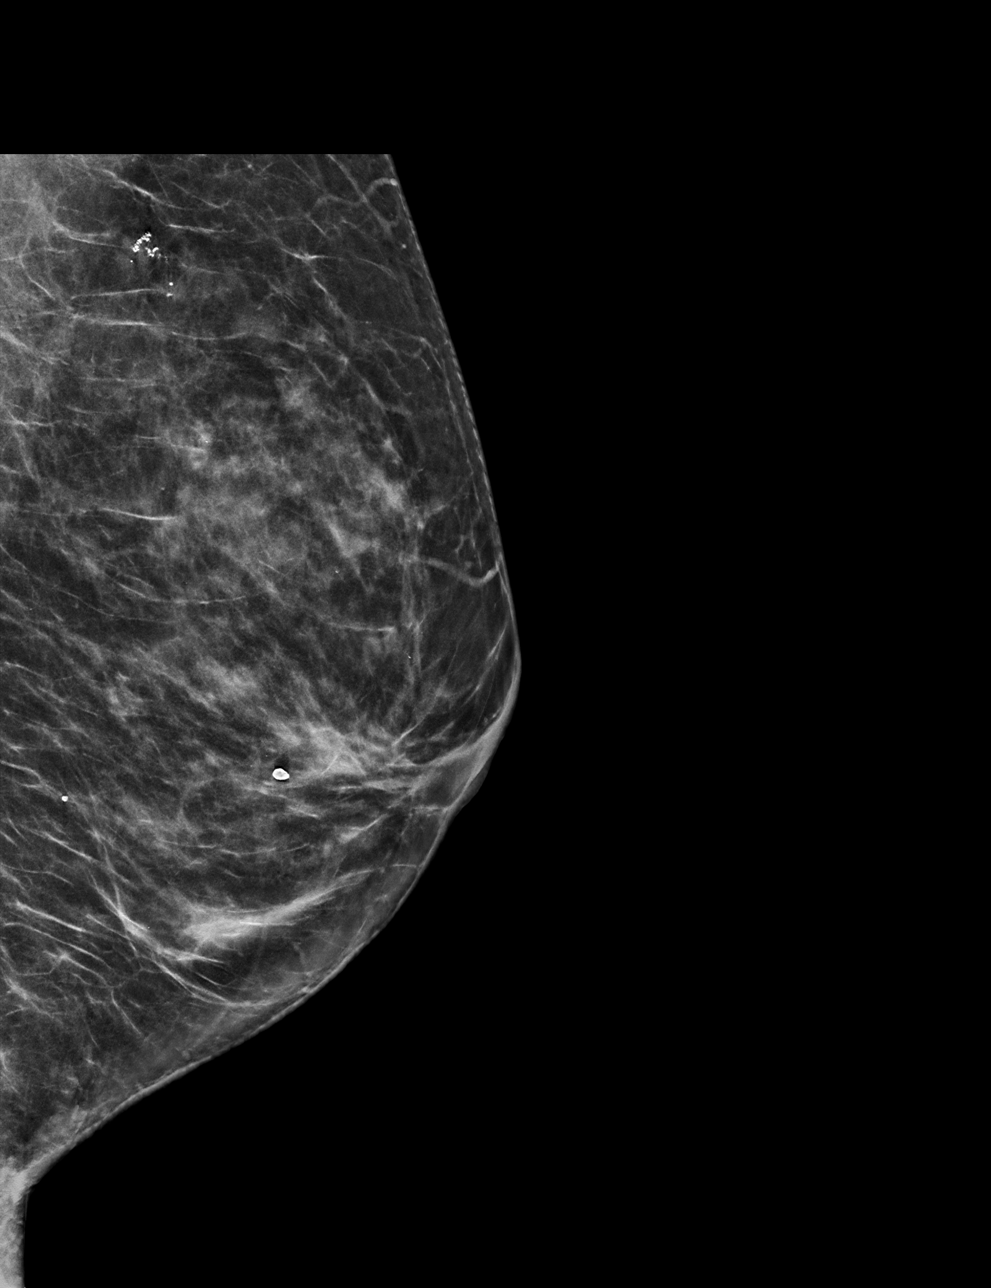

[L ML tomo · tomo slice 32/63.0]
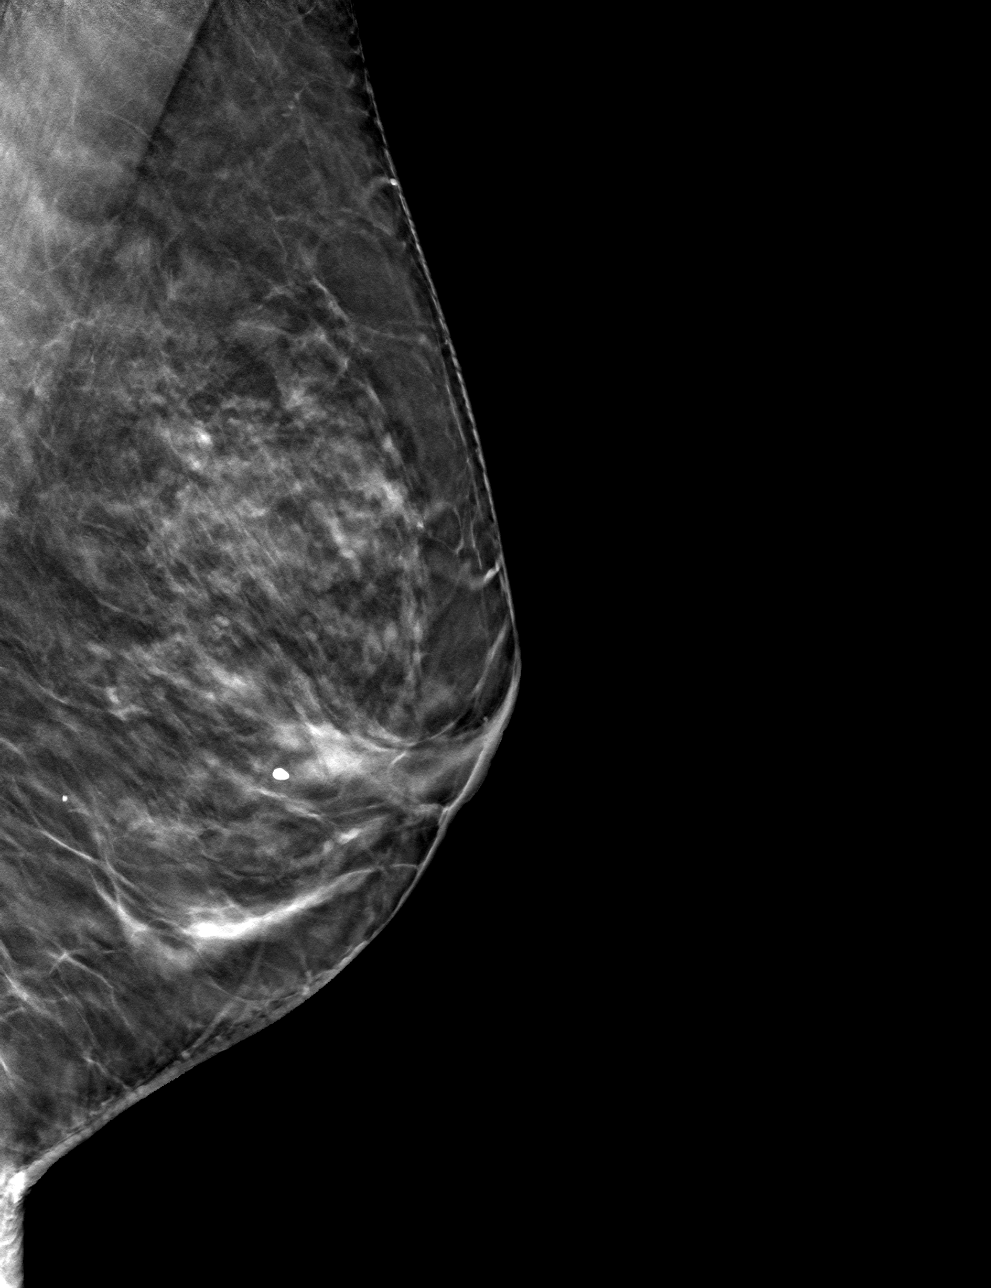

[L CC tomo · tomo slice 31/60.0]
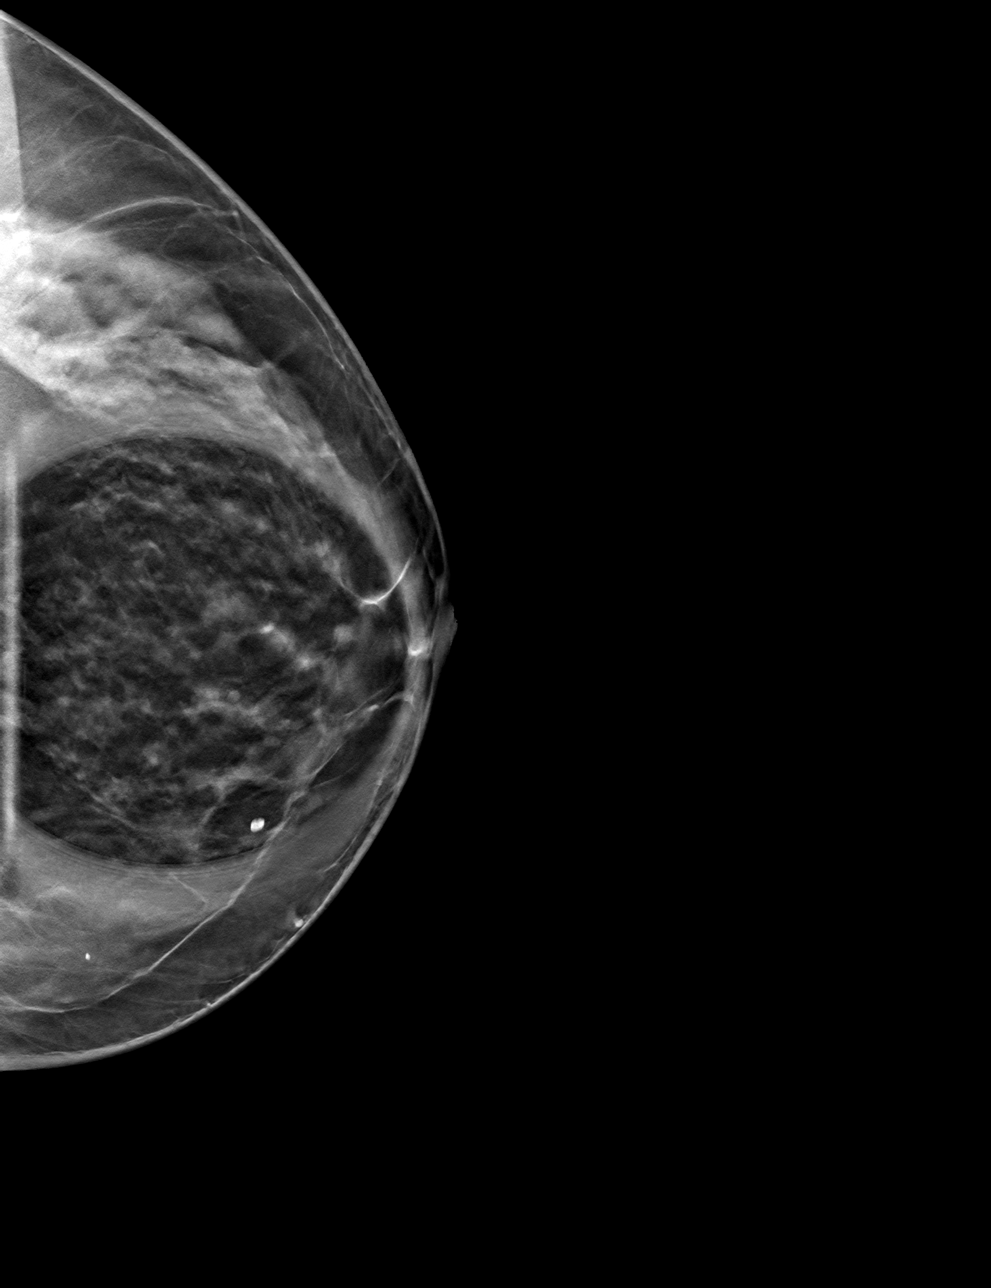

[4 of 12 positions shown; findings below may reference images not displayed]

ACR Breast Density Category c: The breast tissue is heterogeneously
dense, which may obscure small masses.
FINDINGS: Additional spot compression cc and true lateral images were
obtained. There is persistence of an oval circumscribed
subcentimeter mass over the middle third of the upper slightly
medial left breast.

Targeted ultrasound is performed, showing an oval simple cyst over
the [DATE] position of the left breast 2 cm from the nipple
corresponding to the mammographic finding and measuring 2 x 3 x 4
mm. There is an immediately adjacent smaller cyst and adjacent focal
fibrocystic change.
IMPRESSION: 4 mm cyst over the [DATE] position of the left breast 2 cm from the
nipple accounting for the mammographic finding.

RECOMMENDATION:
Recommend continued annual bilateral screening mammographic
follow-up.

I have discussed the findings and recommendations with the patient.
If applicable, a reminder letter will be sent to the patient
regarding the next appointment.

BI-RADS CATEGORY  2: Benign.

## 2020-10-22 IMAGING — US US BREAST*L* LIMITED INC AXILLA
1 series · 6 of 6 positions shown · non-contrast
Comparison: Previous exam(s).

CLINICAL DATA: Recall from screening to evaluate a possible left
breast mass. Patient is on hormone replacement therapy.

EXAM:
DIGITAL DIAGNOSTIC left MAMMOGRAM WITH TOMO
ULTRASOUND left BREAST

[Series 1: us breast*left* limited inc axilla · 0.06mm/px · 6 of 6 slices shown]
[im 1/6]
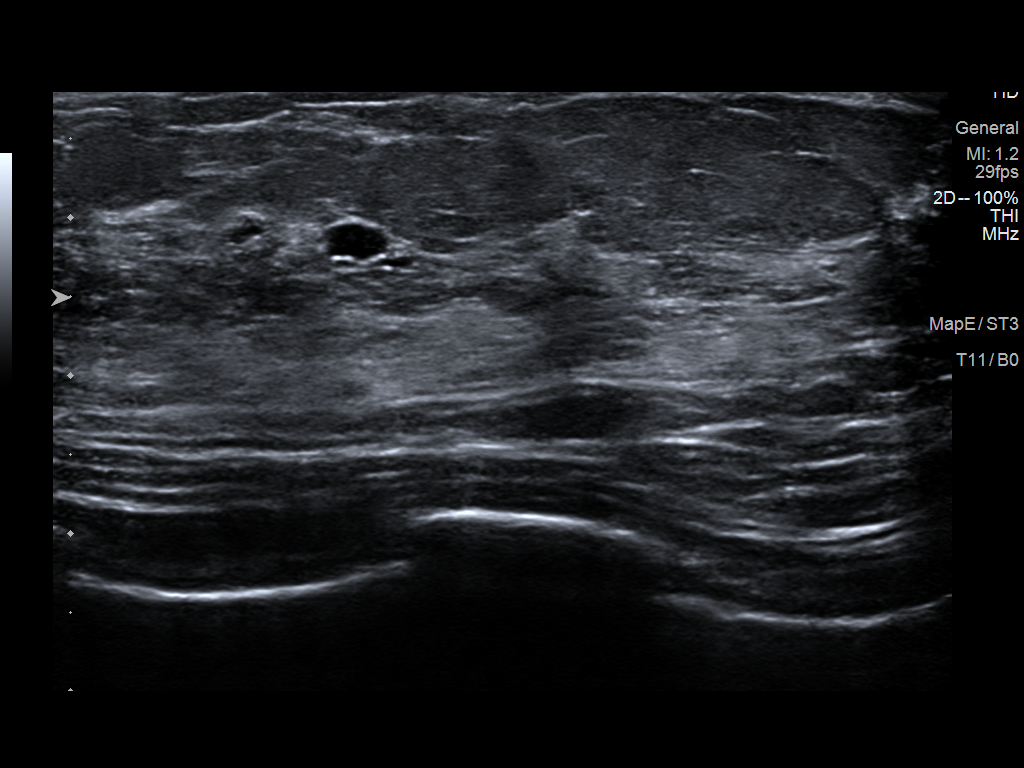
[im 2/6]
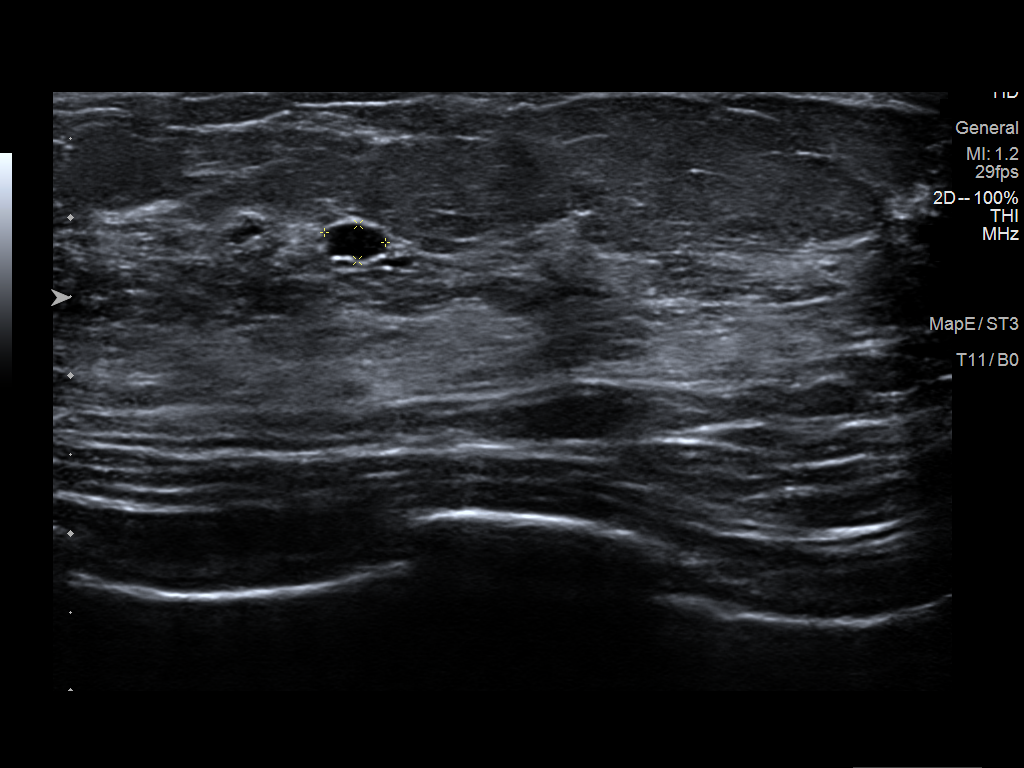
[im 3/6]
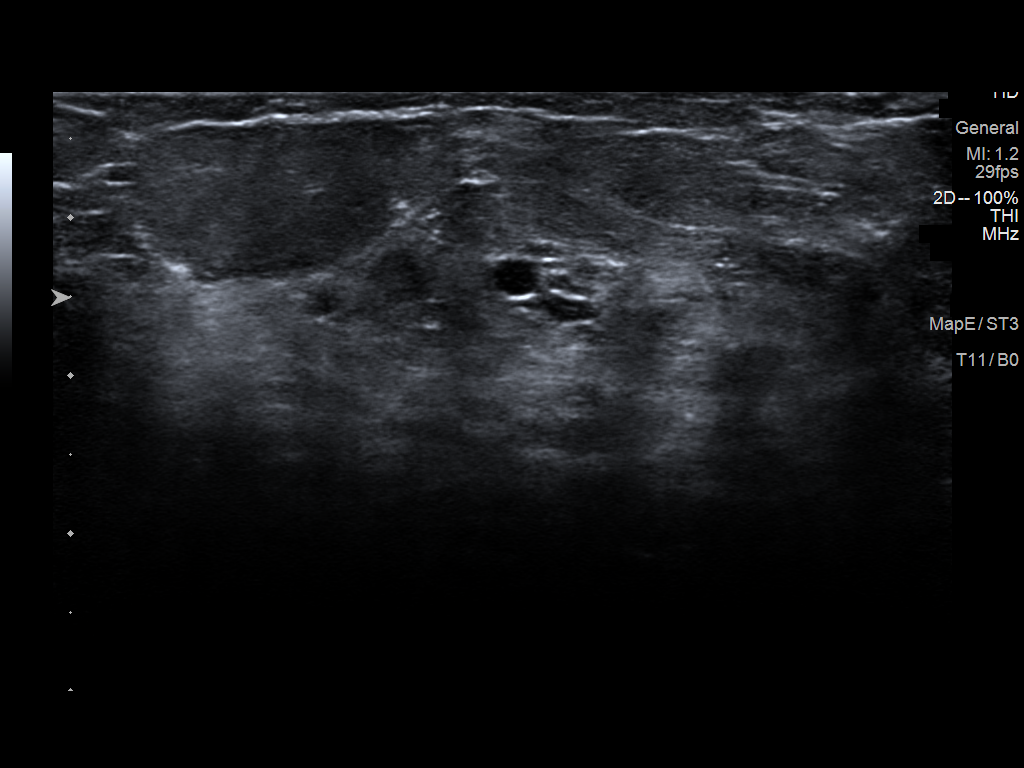
[im 4/6]
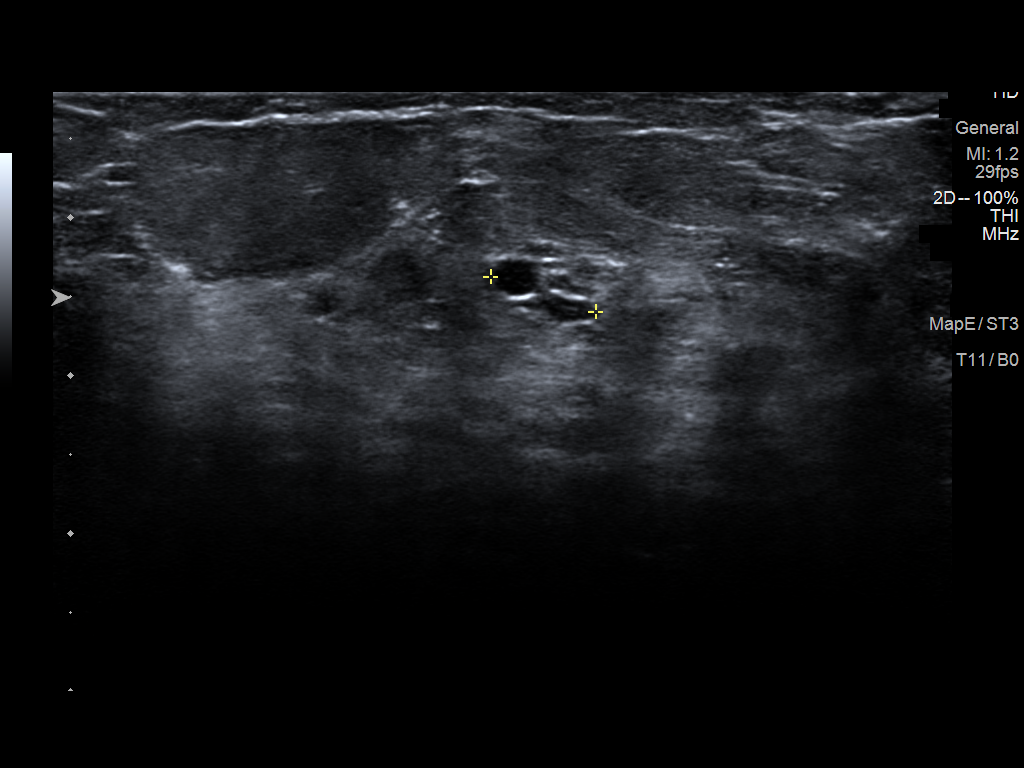
[im 5/6]
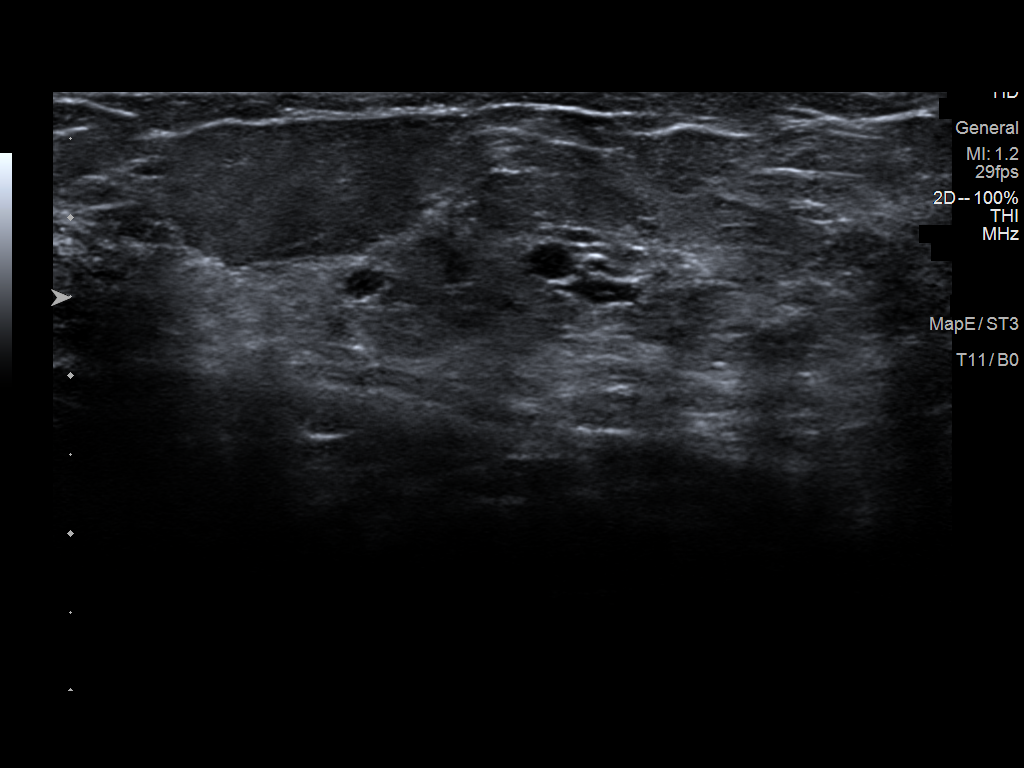
[im 6/6]
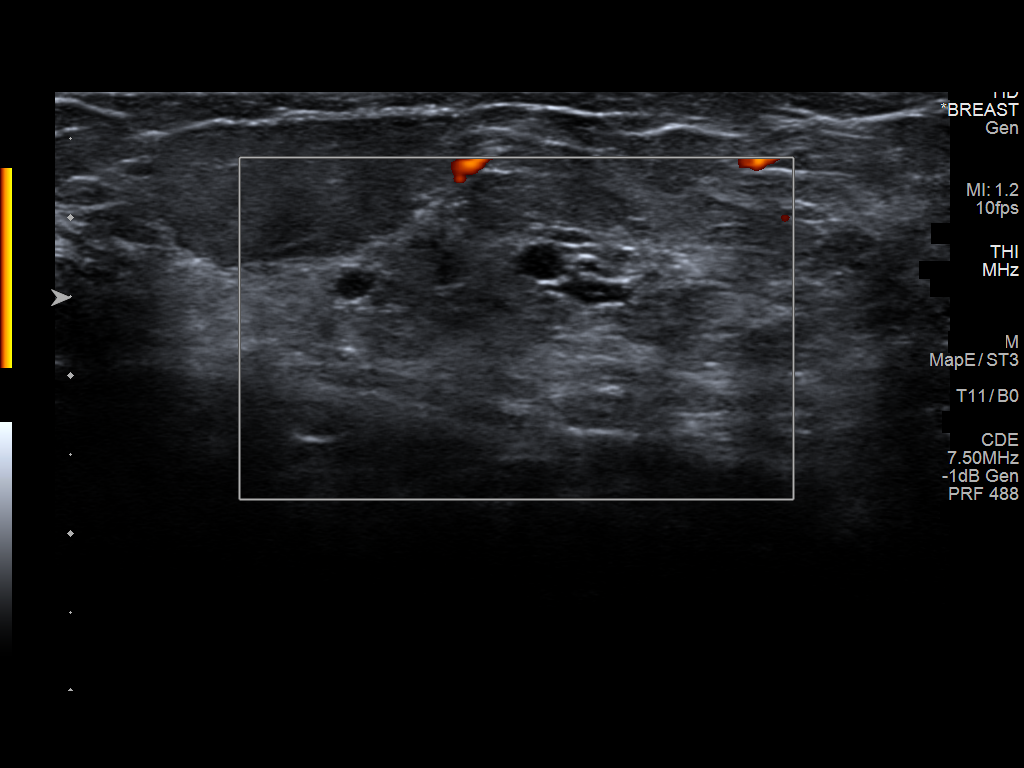

[6 of 6 positions shown; findings below may reference images not displayed]

ACR Breast Density Category c: The breast tissue is heterogeneously
dense, which may obscure small masses.
FINDINGS: Additional spot compression cc and true lateral images were
obtained. There is persistence of an oval circumscribed
subcentimeter mass over the middle third of the upper slightly
medial left breast.

Targeted ultrasound is performed, showing an oval simple cyst over
the [DATE] position of the left breast 2 cm from the nipple
corresponding to the mammographic finding and measuring 2 x 3 x 4
mm. There is an immediately adjacent smaller cyst and adjacent focal
fibrocystic change.
IMPRESSION: 4 mm cyst over the [DATE] position of the left breast 2 cm from the
nipple accounting for the mammographic finding.

RECOMMENDATION:
Recommend continued annual bilateral screening mammographic
follow-up.

I have discussed the findings and recommendations with the patient.
If applicable, a reminder letter will be sent to the patient
regarding the next appointment.

BI-RADS CATEGORY  2: Benign.

## 2020-12-01 ENCOUNTER — Encounter (INDEPENDENT_AMBULATORY_CARE_PROVIDER_SITE_OTHER): Payer: Self-pay | Admitting: *Deleted

## 2020-12-15 DIAGNOSIS — L820 Inflamed seborrheic keratosis: Secondary | ICD-10-CM | POA: Diagnosis not present

## 2020-12-16 DIAGNOSIS — E890 Postprocedural hypothyroidism: Secondary | ICD-10-CM | POA: Diagnosis not present

## 2020-12-16 DIAGNOSIS — Z85850 Personal history of malignant neoplasm of thyroid: Secondary | ICD-10-CM | POA: Diagnosis not present

## 2020-12-31 ENCOUNTER — Other Ambulatory Visit (INDEPENDENT_AMBULATORY_CARE_PROVIDER_SITE_OTHER): Payer: Self-pay

## 2020-12-31 ENCOUNTER — Telehealth (INDEPENDENT_AMBULATORY_CARE_PROVIDER_SITE_OTHER): Payer: Self-pay

## 2020-12-31 ENCOUNTER — Encounter (INDEPENDENT_AMBULATORY_CARE_PROVIDER_SITE_OTHER): Payer: Self-pay

## 2020-12-31 DIAGNOSIS — Z1211 Encounter for screening for malignant neoplasm of colon: Secondary | ICD-10-CM

## 2020-12-31 MED ORDER — SUPREP BOWEL PREP KIT 17.5-3.13-1.6 GM/177ML PO SOLN: 1.0000 | ORAL | 0 refills | Status: DC

## 2020-12-31 NOTE — Telephone Encounter (Signed)
Referring MD/PCP: Willey Blade   Procedure: Tcs  Reason/Indication:  Family Hx of colon ca  Has patient had this procedure before?  yes  If so, when, by whom and where?  2017  Is there a family history of colon cancer?  yes  Who?  What age when diagnosed? Father  Is patient diabetic?   no      Does patient have prosthetic heart valve or mechanical valve?  no  Do you have a pacemaker/defibrillator?  no  Has patient ever had endocarditis/atrial fibrillation? no  Have you had a stroke/heart attack last 6 mths? no  Does patient use oxygen? no  Has patient had joint replacement within last 12 months?  no  Is patient constipated or do they take laxatives? no  Does patient have a history of alcohol/drug use?  no  Is patient on blood thinner such as Coumadin, Plavix and/or Aspirin? no  Do you take medicine for weight loss. no  Medications: Citalopram 20 mg daily, omeprazole 20 mg daily, estradiol 0.5 mg daily, liothyronine 5 mcg daily, levothyroxine 100 mcg daily, cosamine ds daily, Vit D daily, calcium daily  Allergies: nkda  Procedure date & time: 01/29/21 AM

## 2020-12-31 NOTE — Telephone Encounter (Signed)
Martha Hendricks, CMA  

## 2020-12-31 NOTE — Telephone Encounter (Signed)
LeighAnn Lovelace, CMA  

## 2021-01-02 DIAGNOSIS — Z85850 Personal history of malignant neoplasm of thyroid: Secondary | ICD-10-CM | POA: Diagnosis not present

## 2021-01-02 DIAGNOSIS — M85852 Other specified disorders of bone density and structure, left thigh: Secondary | ICD-10-CM | POA: Diagnosis not present

## 2021-01-02 DIAGNOSIS — E890 Postprocedural hypothyroidism: Secondary | ICD-10-CM | POA: Diagnosis not present

## 2021-01-22 DIAGNOSIS — Z1231 Encounter for screening mammogram for malignant neoplasm of breast: Secondary | ICD-10-CM | POA: Diagnosis not present

## 2021-01-22 DIAGNOSIS — Z6823 Body mass index (BMI) 23.0-23.9, adult: Secondary | ICD-10-CM | POA: Diagnosis not present

## 2021-01-22 DIAGNOSIS — Z01419 Encounter for gynecological examination (general) (routine) without abnormal findings: Secondary | ICD-10-CM | POA: Diagnosis not present

## 2021-01-27 ENCOUNTER — Other Ambulatory Visit (HOSPITAL_COMMUNITY)
Admission: RE | Admit: 2021-01-27 | Discharge: 2021-01-27 | Disposition: A | Discharge status: Home / Self Care | Payer: Medicare PPO | Source: Ambulatory Visit | Attending: Internal Medicine | Admitting: Internal Medicine

## 2021-01-27 ENCOUNTER — Other Ambulatory Visit: Payer: Self-pay

## 2021-01-27 DIAGNOSIS — Z20822 Contact with and (suspected) exposure to covid-19: Secondary | ICD-10-CM | POA: Diagnosis not present

## 2021-01-27 DIAGNOSIS — Z01812 Encounter for preprocedural laboratory examination: Secondary | ICD-10-CM | POA: Diagnosis not present

## 2021-01-28 LAB — SARS CORONAVIRUS 2 (TAT 6-24 HRS): SARS Coronavirus 2: NEGATIVE

## 2021-01-29 ENCOUNTER — Encounter (HOSPITAL_COMMUNITY): Payer: Self-pay | Admitting: Internal Medicine

## 2021-01-29 ENCOUNTER — Encounter (HOSPITAL_COMMUNITY)
Admission: RE | Disposition: A | Discharge status: Home / Self Care | Payer: Self-pay | Source: Home / Self Care | Attending: Internal Medicine

## 2021-01-29 ENCOUNTER — Ambulatory Visit (HOSPITAL_COMMUNITY)
Admission: RE | Admit: 2021-01-29 | Discharge: 2021-01-29 | Disposition: A | Discharge status: Home / Self Care | Payer: Medicare PPO | Attending: Internal Medicine | Admitting: Internal Medicine

## 2021-01-29 ENCOUNTER — Other Ambulatory Visit: Payer: Self-pay

## 2021-01-29 DIAGNOSIS — Z85850 Personal history of malignant neoplasm of thyroid: Secondary | ICD-10-CM | POA: Diagnosis not present

## 2021-01-29 DIAGNOSIS — Z86010 Personal history of colonic polyps: Secondary | ICD-10-CM | POA: Insufficient documentation

## 2021-01-29 DIAGNOSIS — Z79899 Other long term (current) drug therapy: Secondary | ICD-10-CM | POA: Insufficient documentation

## 2021-01-29 DIAGNOSIS — Z7982 Long term (current) use of aspirin: Secondary | ICD-10-CM | POA: Insufficient documentation

## 2021-01-29 DIAGNOSIS — Z800 Family history of malignant neoplasm of digestive organs: Secondary | ICD-10-CM | POA: Diagnosis not present

## 2021-01-29 DIAGNOSIS — D122 Benign neoplasm of ascending colon: Secondary | ICD-10-CM | POA: Insufficient documentation

## 2021-01-29 DIAGNOSIS — Z1211 Encounter for screening for malignant neoplasm of colon: Secondary | ICD-10-CM | POA: Diagnosis not present

## 2021-01-29 DIAGNOSIS — D123 Benign neoplasm of transverse colon: Secondary | ICD-10-CM | POA: Diagnosis not present

## 2021-01-29 DIAGNOSIS — Z79890 Hormone replacement therapy (postmenopausal): Secondary | ICD-10-CM | POA: Insufficient documentation

## 2021-01-29 DIAGNOSIS — K5730 Diverticulosis of large intestine without perforation or abscess without bleeding: Secondary | ICD-10-CM | POA: Diagnosis not present

## 2021-01-29 DIAGNOSIS — Z09 Encounter for follow-up examination after completed treatment for conditions other than malignant neoplasm: Secondary | ICD-10-CM | POA: Diagnosis not present

## 2021-01-29 DIAGNOSIS — K644 Residual hemorrhoidal skin tags: Secondary | ICD-10-CM | POA: Diagnosis not present

## 2021-01-29 HISTORY — PX: POLYPECTOMY: SHX5525

## 2021-01-29 HISTORY — PX: COLONOSCOPY: SHX5424

## 2021-01-29 HISTORY — PX: BIOPSY: SHX5522

## 2021-01-29 LAB — HM COLONOSCOPY

## 2021-01-29 SURGERY — COLONOSCOPY
Anesthesia: Moderate Sedation

## 2021-01-29 MED ORDER — MEPERIDINE HCL 50 MG/ML IJ SOLN
INTRAMUSCULAR | Status: AC
  Filled 2021-01-29: qty 1

## 2021-01-29 MED ORDER — MEPERIDINE HCL 50 MG/ML IJ SOLN
INTRAMUSCULAR | Status: DC | PRN
  Administered 2021-01-29: 25 mg

## 2021-01-29 MED ORDER — MIDAZOLAM HCL 5 MG/5ML IJ SOLN
INTRAMUSCULAR | Status: AC
  Filled 2021-01-29: qty 10

## 2021-01-29 MED ORDER — SODIUM CHLORIDE 0.9 % IV SOLN: INTRAVENOUS | Status: DC

## 2021-01-29 MED ORDER — MIDAZOLAM HCL 5 MG/5ML IJ SOLN
INTRAMUSCULAR | Status: DC | PRN
  Administered 2021-01-29: 2 mg via INTRAVENOUS
  Administered 2021-01-29 (×2): 1 mg via INTRAVENOUS

## 2021-01-29 NOTE — Op Note (Signed)
[facility] Patient Name: Martha Hendricks Procedure Date: 01/29/2021 8:35 AM MRN: [id number] Date of Birth: 09-Jun-1951 Attending MD: Hildred Laser , MD CSN: [id number] Age: 70 Admit Type: Outpatient Procedure:                Colonoscopy Indications:              High risk colon cancer surveillance: Personal                            history of colonic polyps Providers:                Hildred Laser, MD, Janeece Riggers, RN, Raphael Gibney,                            Technician Referring MD:             Asencion Noble, MD Medicines:                Meperidine 25 mg IV, Midazolam 4 mg IV Complications:            No immediate complications. Estimated Blood Loss:     Estimated blood loss was minimal. Procedure:                Pre-Anesthesia Assessment:                           - Prior to the procedure, a History and Physical                            was performed, and patient medications and                            allergies were reviewed. The patient's tolerance of                            previous anesthesia was also reviewed. The risks                            and benefits of the procedure and the sedation                            options and risks were discussed with the patient.                            All questions were answered, and informed consent                            was obtained. Prior Anticoagulants: The patient has                            taken no previous anticoagulant or antiplatelet                            agents except for aspirin. ASA Grade Assessment: II                            -  A patient with mild systemic disease. After                            reviewing the risks and benefits, the patient was                            deemed in satisfactory condition to undergo the                            procedure.                           After obtaining informed consent, the colonoscope                            was passed under direct vision.  Throughout the                            procedure, the patient's blood pressure, pulse, and                            oxygen saturations were monitored continuously. The                            PCF-HQ190L ([id number]) scope was introduced through                            the anus and advanced to the the cecum, identified                            by appendiceal orifice and ileocecal valve. The                            colonoscopy was performed without difficulty. The                            patient tolerated the procedure well. The quality                            of the bowel preparation was adequate. The                            ileocecal valve, appendiceal orifice, and rectum                            were photographed. Scope In: 8:57:09 AM Scope Out: 9:22:53 AM Scope Withdrawal Time: 0 hours 17 minutes 4 seconds  Total Procedure Duration: 0 hours 25 minutes 44 seconds  Findings:      Skin tags were found on perianal exam.      Two sessile polyps were found in the hepatic flexure and ascending       colon. The polyps were small in size. These polyps were removed with a       cold snare. Resection and retrieval were complete. The pathology  specimen was placed into Bottle Number 1.      A diminutive polyp was found in the splenic flexure. Biopsies were taken       with a cold forceps for histology. The pathology specimen was placed       into Bottle Number 1.      A single diverticulum was found in the splenic flexure.      External hemorrhoids were found during retroflexion. The hemorrhoids       were small. Impression:               - Perianal skin tags found on perianal exam.                           - Two small polyps at the hepatic flexure and in                            the ascending colon, removed with a cold snare.                            Resected and retrieved.                           - One diminutive polyp at the splenic flexure.                             Biopsied.                           - Diverticulosis at the splenic flexure.                           - External hemorrhoids. Moderate Sedation:      Moderate (conscious) sedation was administered by the endoscopy nurse       and supervised by the endoscopist. The following parameters were       monitored: oxygen saturation, heart rate, blood pressure, CO2       capnography and response to care. Total physician intraservice time was       30 minutes. Recommendation:           - Patient has a contact number available for                            emergencies. The signs and symptoms of potential                            delayed complications were discussed with the                            patient. Return to normal activities tomorrow.                            Written discharge instructions were provided to the                            patient.                           -  Resume previous diet today.                           - Continue present medications.                           - No aspirin, ibuprofen, naproxen, or other                            non-steroidal anti-inflammatory drugs for 1 day.                           - Await pathology results.                           - Repeat colonoscopy in 5 years for surveillance. Procedure Code(s):        --- Professional ---                           [id number], Colonoscopy, flexible; with removal of                            tumor(s), polyp(s), or other lesion(s) by snare                            technique                           45380, 59, Colonoscopy, flexible; with biopsy,                            single or multiple                           99153, Moderate sedation; each additional 15                            minutes intraservice time                           G0500, Moderate sedation services provided by the                            same physician or other qualified health care                             professional performing a gastrointestinal                            endoscopic service that sedation supports,                            requiring the presence of an independent trained                            observer to assist in the monitoring of the  patient's level of consciousness and physiological                            status; initial 15 minutes of intra-service time;                            patient age 61 years or older (additional time may                            be reported with [phone], as appropriate) Diagnosis Code(s):        --- Professional ---                           K63.5, Polyp of colon                           K64.4, Residual hemorrhoidal skin tags                           Z86.010, Personal history of colonic polyps                           K57.30, Diverticulosis of large intestine without                            perforation or abscess without bleeding CPT copyright 2019 American Medical Association. All rights reserved. The codes documented in this report are preliminary and upon coder review may  be revised to meet current compliance requirements. Hildred Laser, MD Hildred Laser, MD 01/29/2021 9:34:16 AM This report has been signed electronically. Number of Addenda: 0

## 2021-01-29 NOTE — Discharge Instructions (Signed)
No aspirin or NSAIDs for 24 hours. Resume other medications as before. Resume usual diet. No driving for 24 hours. Physician will call with biopsy results. Next colonoscopy in 5 years.   Colonoscopy, Adult, Care After This sheet gives you information about how to care for yourself after your procedure. Your doctor may also give you more specific instructions. If you have problems or questions, call your doctor. What can I expect after the procedure? After the procedure, it is common to have:  A small amount of blood in your poop (stool) for 24 hours.  Some gas.  Mild cramping or bloating in your belly (abdomen). Follow these instructions at home: Eating and drinking  Drink enough fluid to keep your pee (urine) pale yellow.  Follow instructions from your doctor about what you cannot eat or drink.  Return to your normal diet as told by your doctor. Avoid heavy or fried foods that are hard to digest.   Activity  Rest as told by your doctor.  Do not sit for a long time without moving. Get up to take short walks every 1-2 hours. This is important. Ask for help if you feel weak or unsteady.  Return to your normal activities as told by your doctor. Ask your doctor what activities are safe for you. To help cramping and bloating:  Try walking around.  Put heat on your belly as told by your doctor. Use the heat source that your doctor recommends, such as a moist heat pack or a heating pad. ? Put a towel between your skin and the heat source. ? Leave the heat on for 20-30 minutes. ? Remove the heat if your skin turns bright red. This is very important if you are unable to feel pain, heat, or cold. You may have a greater risk of getting burned.   General instructions  If you were given a medicine to help you relax (sedative) during your procedure, it can affect you for many hours. Do not drive or use machinery until your doctor says that it is safe.  For the first 24 hours after the  procedure: ? Do not sign important documents. ? Do not drink alcohol. ? Do your daily activities more slowly than normal. ? Eat foods that are soft and easy to digest.  Take over-the-counter or prescription medicines only as told by your doctor.  Keep all follow-up visits as told by your doctor. This is important. Contact a doctor if:  You have blood in your poop 2-3 days after the procedure. Get help right away if:  You have more than a small amount of blood in your poop.  You see large clumps of tissue (blood clots) in your poop.  Your belly is swollen.  You feel like you may vomit (nauseous).  You vomit.  You have a fever.  You have belly pain that gets worse, and medicine does not help your pain. Summary  After the procedure, it is common to have a small amount of blood in your poop. You may also have mild cramping and bloating in your belly.  If you were given a medicine to help you relax (sedative) during your procedure, it can affect you for many hours. Do not drive or use machinery until your doctor says that it is safe.  Get help right away if you have a lot of blood in your poop, feel like you may vomit, have a fever, or have more belly pain. This information is not intended to replace  advice given to you by your health care provider. Make sure you discuss any questions you have with your health care provider. Document Revised: 08/10/2019 Document Reviewed: 04/30/2019 Elsevier Patient Education  [place].  Colon Polyps  Colon polyps are tissue growths inside the colon, which is part of the large intestine. They are one of the types of polyps that can grow in the body. A polyp may be a round bump or a mushroom-shaped growth. You could have one polyp or more than one. Most colon polyps are noncancerous (benign). However, some colon polyps can become cancerous over time. Finding and removing the polyps early can help prevent this. What are the causes? The  exact cause of colon polyps is not known. What increases the risk? The following factors may make you more likely to develop this condition:  Having a family history of colorectal cancer or colon polyps.  Being older than 69 years of age.  Being younger than 69 years of age and having a significant family history of colorectal cancer or colon polyps or a genetic condition that puts you at higher risk of getting colon polyps.  Having inflammatory bowel disease, such as ulcerative colitis or Crohn's disease.  Having certain conditions passed from parent to child (hereditary conditions), such as: ? Familial adenomatous polyposis (FAP). ? Lynch syndrome. ? Turcot syndrome. ? Peutz-Jeghers syndrome. ? MUTYH-associated polyposis (MAP).  Being overweight.  Certain lifestyle factors. These include smoking cigarettes, drinking too much alcohol, not getting enough exercise, and eating a diet that is high in fat and red meat and low in fiber.  Having had childhood cancer that was treated with radiation of the abdomen. What are the signs or symptoms? Many times, there are no symptoms. If you have symptoms, they may include:  Blood coming from the rectum during a bowel movement.  Blood in the stool (feces). The blood may be bright red or very dark in color.  Pain in the abdomen.  A change in bowel habits, such as constipation or diarrhea. How is this diagnosed? This condition is diagnosed with a colonoscopy. This is a procedure in which a lighted, flexible scope is inserted into the opening between the buttocks (anus) and then passed into the colon to examine the area. Polyps are sometimes found when a colonoscopy is done as part of routine cancer screening tests. How is this treated? This condition is treated by removing any polyps that are found. Most polyps can be removed during a colonoscopy. Those polyps will then be tested for cancer. Additional treatment may be needed depending on the  results of testing. Follow these instructions at home: Eating and drinking  Eat foods that are high in fiber, such as fruits, vegetables, and whole grains.  Eat foods that are high in calcium and vitamin D, such as milk, cheese, yogurt, eggs, liver, fish, and broccoli.  Limit foods that are high in fat, such as fried foods and desserts.  Limit the amount of red meat, precooked or cured meat, or other processed meat that you eat, such as hot dogs, sausages, bacon, or meat loaves.  Limit sugary drinks.   Lifestyle  Maintain a healthy weight, or lose weight if recommended by your health care provider.  Exercise every day or as told by your health care provider.  Do not use any products that contain nicotine or tobacco, such as cigarettes, e-cigarettes, and chewing tobacco. If you need help quitting, ask your health care provider.  Do not drink alcohol  if: ? Your health care provider tells you not to drink. ? You are pregnant, may be pregnant, or are planning to become pregnant.  If you drink alcohol: ? Limit how much you use to:  0-1 drink a day for women.  0-2 drinks a day for men. ? Know how much alcohol is in your drink. In the [place] one drink equals one 12 oz bottle of beer (355 mL), one 5 oz glass of wine (148 mL), or one 1 oz glass of hard liquor (44 mL). General instructions  Take over-the-counter and prescription medicines only as told by your health care provider.  Keep all follow-up visits. This is important. This includes having regularly scheduled colonoscopies. Talk to your health care provider about when you need a colonoscopy. Contact a health care provider if:  You have new or worsening bleeding during a bowel movement.  You have new or increased blood in your stool.  You have a change in bowel habits.  You lose weight for no known reason. Summary  Colon polyps are tissue growths inside the colon, which is part of the large intestine. They are one type of  polyp that can grow in the body.  Most colon polyps are noncancerous (benign), but some can become cancerous over time.  This condition is diagnosed with a colonoscopy.  This condition is treated by removing any polyps that are found. Most polyps can be removed during a colonoscopy. This information is not intended to replace advice given to you by your health care provider. Make sure you discuss any questions you have with your health care provider. Document Revised: 01/23/2020 Document Reviewed: 01/23/2020 Elsevier Patient Education  2021 [place].  Diverticulosis  Diverticulosis is a condition that develops when small pouches (diverticula) form in the wall of the large intestine (colon). The colon is where water is absorbed and stool (feces) is formed. The pouches form when the inside layer of the colon pushes through weak spots in the outer layers of the colon. You may have a few pouches or many of them. The pouches usually do not cause problems unless they become inflamed or infected. When this happens, the condition is called diverticulitis. What are the causes? The cause of this condition is not known. What increases the risk? The following factors may make you more likely to develop this condition:  Being older than age 90. Your risk for this condition increases with age. Diverticulosis is rare among people younger than age 69. By age 9, many people have it.  Eating a low-fiber diet.  Having frequent constipation.  Being overweight.  Not getting enough exercise.  Smoking.  Taking over-the-counter pain medicines, like aspirin and ibuprofen.  Having a family history of diverticulosis. What are the signs or symptoms? In most people, there are no symptoms of this condition. If you do have symptoms, they may include:  Bloating.  Cramps in the abdomen.  Constipation or diarrhea.  Pain in the lower left side of the abdomen. How is this diagnosed? Because  diverticulosis usually has no symptoms, it is most often diagnosed during an exam for other colon problems. The condition may be diagnosed by:  Using a flexible scope to examine the colon (colonoscopy).  Taking an X-ray of the colon after dye has been put into the colon (barium enema).  Having a CT scan. How is this treated? You may not need treatment for this condition. Your health care provider may recommend treatment to prevent problems. You may need  treatment if you have symptoms or if you previously had diverticulitis. Treatment may include:  Eating a high-fiber diet.  Taking a fiber supplement.  Taking a live bacteria supplement (probiotic).  Taking medicine to relax your colon.   Follow these instructions at home: Medicines  Take over-the-counter and prescription medicines only as told by your health care provider.  If told by your health care provider, take a fiber supplement or probiotic. Constipation prevention Your condition may cause constipation. To prevent or treat constipation, you may need to:  Drink enough fluid to keep your urine pale yellow.  Take over-the-counter or prescription medicines.  Eat foods that are high in fiber, such as beans, whole grains, and fresh fruits and vegetables.  Limit foods that are high in fat and processed sugars, such as fried or sweet foods.   General instructions  Try not to strain when you have a bowel movement.  Keep all follow-up visits as told by your health care provider. This is important. Contact a health care provider if you:  Have pain in your abdomen.  Have bloating.  Have cramps.  Have not had a bowel movement in 3 days. Get help right away if:  Your pain gets worse.  Your bloating becomes very bad.  You have a fever or chills, and your symptoms suddenly get worse.  You vomit.  You have bowel movements that are bloody or black.  You have bleeding from your rectum. Summary  Diverticulosis is a  condition that develops when small pouches (diverticula) form in the wall of the large intestine (colon).  You may have a few pouches or many of them.  This condition is most often diagnosed during an exam for other colon problems.  Treatment may include increasing the fiber in your diet, taking supplements, or taking medicines. This information is not intended to replace advice given to you by your health care provider. Make sure you discuss any questions you have with your health care provider. Document Revised: 05/03/2019 Document Reviewed: 05/03/2019 Elsevier Patient Education  [place].

## 2021-01-29 NOTE — H&P (Signed)
Martha Hendricks is an 70 y.o. female.   Chief Complaint: Patient is here for colonoscopy. HPI: Patient is 70 year old Caucasian female who has a history of colonic adenomas as well as family history of colon carcinoma who is here for surveillance colonoscopy.  She denies abdominal pain or rectal bleeding but she has noted a change in her bowel habits.  She states for the last several months she has been having 1-2 loose stools.  She does not have urgency accidents or abdominal cramping.  She does not have nocturnal diarrhea.  She has not changed any of her medications. Last colonoscopy was in 2017 and she did not have any polyps.  On prior colonoscopy 10 years ago she had 3 tubular adenomas. Family history significant for colon carcinoma in maternal grandmother and paternal aunts.   Past Medical History:  Diagnosis Date  . Anxiety   . Arthritis   . Cancer [facility])    thyroid cancer  . Depression   . Hypothyroidism   . Migraines     Past Surgical History:  Procedure Laterality Date  . ABDOMINAL HYSTERECTOMY    . APPENDECTOMY  11/17/2000  . BILATERAL SALPINGOOPHORECTOMY  11/17/2000  . COLONOSCOPY N/A 03/18/2016   Procedure: COLONOSCOPY;  Surgeon: Rogene Houston, MD;  [facility];  Service: Endoscopy;  Laterality: N/A;  930  . ECTOPIC PREGNANCY SURGERY    . ESOPHAGOGASTRODUODENOSCOPY (EGD) WITH ESOPHAGEAL DILATION    . MASS EXCISION Left 01/15/2016   Procedure: EXCISION MASS LEFT MIDDLE FINGER;  Surgeon: Daryll Brod, MD;  Location: [place];  Service: Orthopedics;  Laterality: Left;  . THYROIDECTOMY N/A 03/21/2015   Procedure: TOTAL THYROIDECTOMY;  Surgeon: Jodi Marble, MD;  Location: [place];  Service: ENT;  Laterality: N/A;  . TONSILLECTOMY      Family History  Problem Relation Age of Onset  . Colon cancer Paternal Aunt   . Colon cancer Paternal Grandmother    Social History:  reports that she has never smoked. She has never used smokeless tobacco. She  reports current alcohol use. She reports that she does not use drugs.  Allergies:  Allergies  Allergen Reactions  . Morphine And Related Nausea Only    Medications Prior to Admission  Medication Sig Dispense Refill  . aspirin-acetaminophen-caffeine (EXCEDRIN MIGRAINE) 250-250-65 MG tablet Take 1 tablet by mouth every 8 (eight) hours as needed for headache.     . Calcium Carbonate-Vit D-Min (CALCIUM 1200 PO) Take 1,200 mg by mouth daily.    . cholecalciferol (VITAMIN D) 1000 units tablet Take 1,000 Units by mouth daily.    . citalopram (CELEXA) 20 MG tablet Take 20 mg by mouth daily.    Marland Kitchen estradiol (ESTRACE) 0.5 MG tablet Take 0.5 mg by mouth daily.    Marland Kitchen levothyroxine (SYNTHROID) 100 MCG tablet Take 100 mcg by mouth daily before breakfast.    . liothyronine (CYTOMEL) 5 MCG tablet Take 5 mcg by mouth every morning.    . Misc Natural Products (GLUCOSAMINE CHOND COMPLEX/MSM) TABS Take 1-2 tablets by mouth daily.    . Na Sulfate-K Sulfate-Mg Sulf (SUPREP BOWEL PREP KIT) 17.5-3.13-1.6 GM/177ML SOLN Take 1 kit by mouth as directed. 354 mL 0  . omeprazole (PRILOSEC) 20 MG capsule Take 20 mg by mouth at bedtime.    . Polyvinyl Alcohol-Povidone PF (REFRESH) 1.4-0.6 % SOLN Place 1 drop into both eyes daily as needed (dry eyes).      No results found for this or any previous visit (from  the past 48 hour(s)). No results found.  Review of Systems  Blood pressure 132/74, pulse 75, temperature 97.7 F (36.5 C), temperature source Oral, resp. rate 18, height 5' 5"  (1.651 m), weight 65.8 kg, SpO2 96 %. Physical Exam HENT:     Mouth/Throat:     Mouth: Mucous membranes are moist.     Pharynx: Oropharynx is clear.  Eyes:     General: No scleral icterus.    Conjunctiva/sclera: Conjunctivae normal.  Cardiovascular:     Rate and Rhythm: Normal rate and regular rhythm.     Heart sounds: Normal heart sounds. No murmur heard.   Pulmonary:     Effort: Pulmonary effort is normal.     Breath sounds:  Normal breath sounds.  Abdominal:     General: Abdomen is flat.     Palpations: Abdomen is soft. There is no mass.     Tenderness: There is no abdominal tenderness.  Musculoskeletal:        General: No swelling.     Cervical back: Neck supple.  Lymphadenopathy:     Cervical: No cervical adenopathy.  Skin:    General: Skin is warm and dry.  Neurological:     Mental Status: She is alert.      Assessment/Plan  History of colonic polyps. Family history of colon carcinoma as above. Surveillance colonoscopy.  Hildred Laser, MD 01/29/2021, 8:45 AM

## 2021-01-30 LAB — SURGICAL PATHOLOGY

## 2021-02-03 ENCOUNTER — Encounter (INDEPENDENT_AMBULATORY_CARE_PROVIDER_SITE_OTHER): Payer: Self-pay | Admitting: *Deleted

## 2021-02-03 ENCOUNTER — Encounter (HOSPITAL_COMMUNITY): Payer: Self-pay | Admitting: Internal Medicine

## 2021-02-13 DIAGNOSIS — Z885 Allergy status to narcotic agent status: Secondary | ICD-10-CM | POA: Diagnosis not present

## 2021-02-13 DIAGNOSIS — G8911 Acute pain due to trauma: Secondary | ICD-10-CM | POA: Diagnosis not present

## 2021-02-13 DIAGNOSIS — S0501XA Injury of conjunctiva and corneal abrasion without foreign body, right eye, initial encounter: Secondary | ICD-10-CM | POA: Diagnosis not present

## 2021-03-05 ENCOUNTER — Other Ambulatory Visit: Payer: Self-pay

## 2021-03-05 ENCOUNTER — Ambulatory Visit: Payer: Medicare PPO | Attending: Internal Medicine

## 2021-03-05 ENCOUNTER — Other Ambulatory Visit (HOSPITAL_BASED_OUTPATIENT_CLINIC_OR_DEPARTMENT_OTHER): Payer: Self-pay

## 2021-03-05 DIAGNOSIS — Z23 Encounter for immunization: Secondary | ICD-10-CM

## 2021-03-05 MED ORDER — PFIZER-BIONT COVID-19 VAC-TRIS 30 MCG/0.3ML IM SUSP
INTRAMUSCULAR | 0 refills | Status: AC
  Filled 2021-03-05: qty 0.3, 1d supply, fill #0

## 2021-03-05 NOTE — Progress Notes (Signed)
   Covid-19 Vaccination Clinic  Name:  TANYLAH SCHNOEBELEN    MRN: [id number] DOB: 11/18/1950  03/05/2021  Ms. Schweppe was observed post Covid-19 immunization for 15 minutes without incident. She was provided with Vaccine Information Sheet and instruction to access the V-Safe system.   Ms. Bibby was instructed to call 911 with any severe reactions post vaccine: Marland Kitchen Difficulty breathing  . Swelling of face and throat  . A fast heartbeat  . A bad rash all over body  . Dizziness and weakness   Immunizations Administered    Name Date Dose VIS Date Route   PFIZER Comrnaty(Gray TOP) Covid-19 Vaccine 03/05/2021 12:01 PM 0.3 mL 09/25/2020 Intramuscular   Manufacturer: [employer]   Lot: [id number]   NDC: [phone]

## 2021-04-03 DIAGNOSIS — Z809 Family history of malignant neoplasm, unspecified: Secondary | ICD-10-CM | POA: Diagnosis not present

## 2021-04-23 DIAGNOSIS — D3131 Benign neoplasm of right choroid: Secondary | ICD-10-CM | POA: Diagnosis not present

## 2021-08-18 DIAGNOSIS — L720 Epidermal cyst: Secondary | ICD-10-CM | POA: Diagnosis not present

## 2021-08-18 DIAGNOSIS — L820 Inflamed seborrheic keratosis: Secondary | ICD-10-CM | POA: Diagnosis not present

## 2021-08-18 DIAGNOSIS — D1801 Hemangioma of skin and subcutaneous tissue: Secondary | ICD-10-CM | POA: Diagnosis not present

## 2021-08-18 DIAGNOSIS — L821 Other seborrheic keratosis: Secondary | ICD-10-CM | POA: Diagnosis not present

## 2021-08-18 DIAGNOSIS — L738 Other specified follicular disorders: Secondary | ICD-10-CM | POA: Diagnosis not present

## 2021-08-18 DIAGNOSIS — D2261 Melanocytic nevi of right upper limb, including shoulder: Secondary | ICD-10-CM | POA: Diagnosis not present

## 2021-08-18 DIAGNOSIS — D2262 Melanocytic nevi of left upper limb, including shoulder: Secondary | ICD-10-CM | POA: Diagnosis not present

## 2021-08-18 DIAGNOSIS — L570 Actinic keratosis: Secondary | ICD-10-CM | POA: Diagnosis not present

## 2021-09-16 DIAGNOSIS — C73 Malignant neoplasm of thyroid gland: Secondary | ICD-10-CM | POA: Diagnosis not present

## 2021-09-16 DIAGNOSIS — H8110 Benign paroxysmal vertigo, unspecified ear: Secondary | ICD-10-CM | POA: Diagnosis not present

## 2021-09-21 DIAGNOSIS — R7301 Impaired fasting glucose: Secondary | ICD-10-CM | POA: Diagnosis not present

## 2021-09-21 DIAGNOSIS — E785 Hyperlipidemia, unspecified: Secondary | ICD-10-CM | POA: Diagnosis not present

## 2021-09-21 DIAGNOSIS — Z85850 Personal history of malignant neoplasm of thyroid: Secondary | ICD-10-CM | POA: Diagnosis not present

## 2021-09-28 DIAGNOSIS — E785 Hyperlipidemia, unspecified: Secondary | ICD-10-CM | POA: Diagnosis not present

## 2021-09-28 DIAGNOSIS — Z6824 Body mass index (BMI) 24.0-24.9, adult: Secondary | ICD-10-CM | POA: Diagnosis not present

## 2021-09-28 DIAGNOSIS — Z0000 Encounter for general adult medical examination without abnormal findings: Secondary | ICD-10-CM | POA: Diagnosis not present

## 2021-09-28 DIAGNOSIS — R7301 Impaired fasting glucose: Secondary | ICD-10-CM | POA: Diagnosis not present

## 2021-09-28 DIAGNOSIS — Z85850 Personal history of malignant neoplasm of thyroid: Secondary | ICD-10-CM | POA: Diagnosis not present

## 2021-12-28 DIAGNOSIS — E890 Postprocedural hypothyroidism: Secondary | ICD-10-CM | POA: Diagnosis not present

## 2021-12-28 DIAGNOSIS — Z85850 Personal history of malignant neoplasm of thyroid: Secondary | ICD-10-CM | POA: Diagnosis not present

## 2022-01-04 DIAGNOSIS — Z85850 Personal history of malignant neoplasm of thyroid: Secondary | ICD-10-CM | POA: Diagnosis not present

## 2022-01-04 DIAGNOSIS — M85852 Other specified disorders of bone density and structure, left thigh: Secondary | ICD-10-CM | POA: Diagnosis not present

## 2022-01-04 DIAGNOSIS — E890 Postprocedural hypothyroidism: Secondary | ICD-10-CM | POA: Diagnosis not present

## 2022-02-01 DIAGNOSIS — I781 Nevus, non-neoplastic: Secondary | ICD-10-CM | POA: Diagnosis not present

## 2022-02-01 DIAGNOSIS — D100 Benign neoplasm of lip: Secondary | ICD-10-CM | POA: Diagnosis not present

## 2022-02-02 DIAGNOSIS — Z6823 Body mass index (BMI) 23.0-23.9, adult: Secondary | ICD-10-CM | POA: Diagnosis not present

## 2022-02-02 DIAGNOSIS — Z124 Encounter for screening for malignant neoplasm of cervix: Secondary | ICD-10-CM | POA: Diagnosis not present

## 2022-02-02 DIAGNOSIS — M8588 Other specified disorders of bone density and structure, other site: Secondary | ICD-10-CM | POA: Diagnosis not present

## 2022-02-02 DIAGNOSIS — Z1272 Encounter for screening for malignant neoplasm of vagina: Secondary | ICD-10-CM | POA: Diagnosis not present

## 2022-02-02 DIAGNOSIS — Z1231 Encounter for screening mammogram for malignant neoplasm of breast: Secondary | ICD-10-CM | POA: Diagnosis not present

## 2022-02-02 DIAGNOSIS — N958 Other specified menopausal and perimenopausal disorders: Secondary | ICD-10-CM | POA: Diagnosis not present

## 2022-03-02 DIAGNOSIS — J029 Acute pharyngitis, unspecified: Secondary | ICD-10-CM | POA: Diagnosis not present

## 2022-04-27 DIAGNOSIS — H04123 Dry eye syndrome of bilateral lacrimal glands: Secondary | ICD-10-CM | POA: Diagnosis not present

## 2022-08-02 DIAGNOSIS — H01002 Unspecified blepharitis right lower eyelid: Secondary | ICD-10-CM | POA: Diagnosis not present

## 2022-08-02 DIAGNOSIS — H01001 Unspecified blepharitis right upper eyelid: Secondary | ICD-10-CM | POA: Diagnosis not present

## 2022-08-02 DIAGNOSIS — H26492 Other secondary cataract, left eye: Secondary | ICD-10-CM | POA: Diagnosis not present

## 2022-08-02 DIAGNOSIS — H01004 Unspecified blepharitis left upper eyelid: Secondary | ICD-10-CM | POA: Diagnosis not present

## 2022-08-19 DIAGNOSIS — L820 Inflamed seborrheic keratosis: Secondary | ICD-10-CM | POA: Diagnosis not present

## 2022-08-19 DIAGNOSIS — D2262 Melanocytic nevi of left upper limb, including shoulder: Secondary | ICD-10-CM | POA: Diagnosis not present

## 2022-08-19 DIAGNOSIS — L603 Nail dystrophy: Secondary | ICD-10-CM | POA: Diagnosis not present

## 2022-08-19 DIAGNOSIS — D1801 Hemangioma of skin and subcutaneous tissue: Secondary | ICD-10-CM | POA: Diagnosis not present

## 2022-08-19 DIAGNOSIS — L821 Other seborrheic keratosis: Secondary | ICD-10-CM | POA: Diagnosis not present

## 2022-08-19 DIAGNOSIS — L918 Other hypertrophic disorders of the skin: Secondary | ICD-10-CM | POA: Diagnosis not present

## 2022-08-19 DIAGNOSIS — L814 Other melanin hyperpigmentation: Secondary | ICD-10-CM | POA: Diagnosis not present

## 2023-01-03 ENCOUNTER — Other Ambulatory Visit (HOSPITAL_COMMUNITY): Payer: Self-pay | Admitting: Internal Medicine

## 2023-01-03 ENCOUNTER — Ambulatory Visit (HOSPITAL_COMMUNITY)
Admission: RE | Admit: 2023-01-03 | Discharge: 2023-01-03 | Disposition: A | Discharge status: Home / Self Care | Payer: Medicare PPO | Source: Ambulatory Visit | Attending: Internal Medicine | Admitting: Internal Medicine

## 2023-01-03 DIAGNOSIS — M545 Low back pain, unspecified: Secondary | ICD-10-CM | POA: Diagnosis not present

## 2023-01-03 DIAGNOSIS — M48061 Spinal stenosis, lumbar region without neurogenic claudication: Secondary | ICD-10-CM | POA: Diagnosis not present

## 2023-01-05 DIAGNOSIS — M85852 Other specified disorders of bone density and structure, left thigh: Secondary | ICD-10-CM | POA: Diagnosis not present

## 2023-01-05 DIAGNOSIS — E890 Postprocedural hypothyroidism: Secondary | ICD-10-CM | POA: Diagnosis not present

## 2023-01-05 DIAGNOSIS — Z833 Family history of diabetes mellitus: Secondary | ICD-10-CM | POA: Diagnosis not present

## 2023-01-05 DIAGNOSIS — Z85850 Personal history of malignant neoplasm of thyroid: Secondary | ICD-10-CM | POA: Diagnosis not present

## 2023-01-27 DIAGNOSIS — F329 Major depressive disorder, single episode, unspecified: Secondary | ICD-10-CM | POA: Diagnosis not present

## 2023-01-27 DIAGNOSIS — K219 Gastro-esophageal reflux disease without esophagitis: Secondary | ICD-10-CM | POA: Diagnosis not present

## 2023-01-27 DIAGNOSIS — R7301 Impaired fasting glucose: Secondary | ICD-10-CM | POA: Diagnosis not present

## 2023-01-27 DIAGNOSIS — Z79899 Other long term (current) drug therapy: Secondary | ICD-10-CM | POA: Diagnosis not present

## 2023-01-27 DIAGNOSIS — C73 Malignant neoplasm of thyroid gland: Secondary | ICD-10-CM | POA: Diagnosis not present

## 2023-01-27 DIAGNOSIS — E785 Hyperlipidemia, unspecified: Secondary | ICD-10-CM | POA: Diagnosis not present

## 2023-02-03 ENCOUNTER — Other Ambulatory Visit (HOSPITAL_COMMUNITY): Payer: Self-pay | Admitting: Internal Medicine

## 2023-02-03 DIAGNOSIS — E785 Hyperlipidemia, unspecified: Secondary | ICD-10-CM | POA: Diagnosis not present

## 2023-02-03 DIAGNOSIS — Z85850 Personal history of malignant neoplasm of thyroid: Secondary | ICD-10-CM | POA: Diagnosis not present

## 2023-02-03 DIAGNOSIS — R7309 Other abnormal glucose: Secondary | ICD-10-CM | POA: Diagnosis not present

## 2023-02-03 DIAGNOSIS — N950 Postmenopausal bleeding: Secondary | ICD-10-CM | POA: Diagnosis not present

## 2023-02-07 DIAGNOSIS — Z6825 Body mass index (BMI) 25.0-25.9, adult: Secondary | ICD-10-CM | POA: Diagnosis not present

## 2023-02-07 DIAGNOSIS — Z1231 Encounter for screening mammogram for malignant neoplasm of breast: Secondary | ICD-10-CM | POA: Diagnosis not present

## 2023-02-07 DIAGNOSIS — Z01419 Encounter for gynecological examination (general) (routine) without abnormal findings: Secondary | ICD-10-CM | POA: Diagnosis not present

## 2023-02-10 DIAGNOSIS — D485 Neoplasm of uncertain behavior of skin: Secondary | ICD-10-CM | POA: Diagnosis not present

## 2023-02-10 DIAGNOSIS — L820 Inflamed seborrheic keratosis: Secondary | ICD-10-CM | POA: Diagnosis not present

## 2023-02-10 DIAGNOSIS — L821 Other seborrheic keratosis: Secondary | ICD-10-CM | POA: Diagnosis not present

## 2023-02-10 DIAGNOSIS — L570 Actinic keratosis: Secondary | ICD-10-CM | POA: Diagnosis not present

## 2023-02-24 ENCOUNTER — Ambulatory Visit (HOSPITAL_COMMUNITY)
Admission: RE | Admit: 2023-02-24 | Discharge: 2023-02-24 | Disposition: A | Discharge status: Home / Self Care | Payer: Medicare PPO | Source: Ambulatory Visit | Attending: Internal Medicine | Admitting: Internal Medicine

## 2023-02-24 DIAGNOSIS — E785 Hyperlipidemia, unspecified: Secondary | ICD-10-CM | POA: Insufficient documentation

## 2023-03-08 ENCOUNTER — Other Ambulatory Visit (HOSPITAL_COMMUNITY): Payer: Self-pay | Admitting: Internal Medicine

## 2023-03-08 DIAGNOSIS — R222 Localized swelling, mass and lump, trunk: Secondary | ICD-10-CM

## 2023-03-10 ENCOUNTER — Encounter (HOSPITAL_BASED_OUTPATIENT_CLINIC_OR_DEPARTMENT_OTHER): Payer: Self-pay

## 2023-03-10 ENCOUNTER — Ambulatory Visit (HOSPITAL_BASED_OUTPATIENT_CLINIC_OR_DEPARTMENT_OTHER)
Admission: RE | Admit: 2023-03-10 | Discharge: 2023-03-10 | Disposition: A | Discharge status: Home / Self Care | Payer: Medicare PPO | Source: Ambulatory Visit | Attending: Internal Medicine | Admitting: Internal Medicine

## 2023-03-10 DIAGNOSIS — R222 Localized swelling, mass and lump, trunk: Secondary | ICD-10-CM | POA: Insufficient documentation

## 2023-03-10 DIAGNOSIS — R918 Other nonspecific abnormal finding of lung field: Secondary | ICD-10-CM | POA: Diagnosis not present

## 2023-04-19 DIAGNOSIS — D3131 Benign neoplasm of right choroid: Secondary | ICD-10-CM | POA: Diagnosis not present

## 2023-05-05 DIAGNOSIS — E785 Hyperlipidemia, unspecified: Secondary | ICD-10-CM | POA: Diagnosis not present

## 2023-05-05 DIAGNOSIS — Z79899 Other long term (current) drug therapy: Secondary | ICD-10-CM | POA: Diagnosis not present

## 2023-05-10 DIAGNOSIS — E785 Hyperlipidemia, unspecified: Secondary | ICD-10-CM | POA: Diagnosis not present

## 2023-05-10 DIAGNOSIS — I700 Atherosclerosis of aorta: Secondary | ICD-10-CM | POA: Diagnosis not present

## 2023-07-25 DIAGNOSIS — F4322 Adjustment disorder with anxiety: Secondary | ICD-10-CM | POA: Diagnosis not present

## 2023-08-23 DIAGNOSIS — F4322 Adjustment disorder with anxiety: Secondary | ICD-10-CM | POA: Diagnosis not present

## 2023-09-27 DIAGNOSIS — F4322 Adjustment disorder with anxiety: Secondary | ICD-10-CM | POA: Diagnosis not present

## 2023-10-06 DIAGNOSIS — L821 Other seborrheic keratosis: Secondary | ICD-10-CM | POA: Diagnosis not present

## 2023-10-06 DIAGNOSIS — D2261 Melanocytic nevi of right upper limb, including shoulder: Secondary | ICD-10-CM | POA: Diagnosis not present

## 2023-10-06 DIAGNOSIS — L814 Other melanin hyperpigmentation: Secondary | ICD-10-CM | POA: Diagnosis not present

## 2023-10-06 DIAGNOSIS — L570 Actinic keratosis: Secondary | ICD-10-CM | POA: Diagnosis not present

## 2023-10-06 DIAGNOSIS — D2262 Melanocytic nevi of left upper limb, including shoulder: Secondary | ICD-10-CM | POA: Diagnosis not present

## 2023-12-12 ENCOUNTER — Other Ambulatory Visit: Payer: Self-pay | Admitting: Medical Genetics

## 2023-12-20 DIAGNOSIS — K219 Gastro-esophageal reflux disease without esophagitis: Secondary | ICD-10-CM | POA: Diagnosis not present

## 2023-12-22 ENCOUNTER — Other Ambulatory Visit: Payer: Medicare PPO

## 2024-01-03 ENCOUNTER — Other Ambulatory Visit

## 2024-01-03 ENCOUNTER — Other Ambulatory Visit: Payer: Self-pay | Admitting: Medical Genetics

## 2024-01-03 DIAGNOSIS — Z006 Encounter for examination for normal comparison and control in clinical research program: Secondary | ICD-10-CM

## 2024-01-10 DIAGNOSIS — Z85850 Personal history of malignant neoplasm of thyroid: Secondary | ICD-10-CM | POA: Diagnosis not present

## 2024-01-10 DIAGNOSIS — M85852 Other specified disorders of bone density and structure, left thigh: Secondary | ICD-10-CM | POA: Diagnosis not present

## 2024-01-10 DIAGNOSIS — E890 Postprocedural hypothyroidism: Secondary | ICD-10-CM | POA: Diagnosis not present

## 2024-01-10 DIAGNOSIS — Z833 Family history of diabetes mellitus: Secondary | ICD-10-CM | POA: Diagnosis not present

## 2024-01-25 DIAGNOSIS — K529 Noninfective gastroenteritis and colitis, unspecified: Secondary | ICD-10-CM | POA: Diagnosis not present

## 2024-01-30 DIAGNOSIS — R7301 Impaired fasting glucose: Secondary | ICD-10-CM | POA: Diagnosis not present

## 2024-01-30 DIAGNOSIS — F329 Major depressive disorder, single episode, unspecified: Secondary | ICD-10-CM | POA: Diagnosis not present

## 2024-01-30 DIAGNOSIS — E785 Hyperlipidemia, unspecified: Secondary | ICD-10-CM | POA: Diagnosis not present

## 2024-01-30 DIAGNOSIS — N950 Postmenopausal bleeding: Secondary | ICD-10-CM | POA: Diagnosis not present

## 2024-01-30 DIAGNOSIS — Z79899 Other long term (current) drug therapy: Secondary | ICD-10-CM | POA: Diagnosis not present

## 2024-01-30 LAB — GENECONNECT MOLECULAR SCREEN

## 2024-01-31 ENCOUNTER — Other Ambulatory Visit: Payer: Self-pay | Admitting: Medical Genetics

## 2024-01-31 ENCOUNTER — Telehealth: Payer: Self-pay | Admitting: Medical Genetics

## 2024-01-31 DIAGNOSIS — Z006 Encounter for examination for normal comparison and control in clinical research program: Secondary | ICD-10-CM

## 2024-01-31 NOTE — Progress Notes (Signed)
 Initial sample was a TNP. New order requested. Confirmed consent on file.

## 2024-01-31 NOTE — Telephone Encounter (Signed)
 [facility]  01/31/2024  3:44 PM  Confirmed I was speaking with Martha Hendricks [id number] by using name and DOB. Informed participant the reason for this call is to follow-up on a recent buccal sample the participant provided at one of the [facility] lab locations. Informed participant the test was not able to be completed with this sample and apologized for the inconvenience. Participant was requested to provide a new sample at one of our participating labs at no cost so that participant can continue participation and receive test results. Informed participant they do not need to be fasting and if there are other samples that need to be drawn, they can be done at the same visit. Participant has not had a blood transfusion or blood product in the last 30 days. Participant agreed to provide another sample. Participant was provided the [employer] program website to learn why this may have happened. Participant was thanked for their time and continued support of the above study.    [name], BS   [facility] Clinical Research Specialist II Direct Dial: [phone]  Fax: [phone]

## 2024-02-06 DIAGNOSIS — R7301 Impaired fasting glucose: Secondary | ICD-10-CM | POA: Diagnosis not present

## 2024-02-06 DIAGNOSIS — Z0001 Encounter for general adult medical examination with abnormal findings: Secondary | ICD-10-CM | POA: Diagnosis not present

## 2024-02-06 DIAGNOSIS — E785 Hyperlipidemia, unspecified: Secondary | ICD-10-CM | POA: Diagnosis not present

## 2024-02-06 DIAGNOSIS — Z85850 Personal history of malignant neoplasm of thyroid: Secondary | ICD-10-CM | POA: Diagnosis not present

## 2024-02-06 DIAGNOSIS — F329 Major depressive disorder, single episode, unspecified: Secondary | ICD-10-CM | POA: Diagnosis not present

## 2024-02-06 DIAGNOSIS — I2583 Coronary atherosclerosis due to lipid rich plaque: Secondary | ICD-10-CM | POA: Diagnosis not present

## 2024-02-06 DIAGNOSIS — I700 Atherosclerosis of aorta: Secondary | ICD-10-CM | POA: Diagnosis not present

## 2024-03-14 DIAGNOSIS — N958 Other specified menopausal and perimenopausal disorders: Secondary | ICD-10-CM | POA: Diagnosis not present

## 2024-03-14 DIAGNOSIS — Z124 Encounter for screening for malignant neoplasm of cervix: Secondary | ICD-10-CM | POA: Diagnosis not present

## 2024-03-14 DIAGNOSIS — Z6824 Body mass index (BMI) 24.0-24.9, adult: Secondary | ICD-10-CM | POA: Diagnosis not present

## 2024-03-14 DIAGNOSIS — M8588 Other specified disorders of bone density and structure, other site: Secondary | ICD-10-CM | POA: Diagnosis not present

## 2024-03-14 DIAGNOSIS — Z1272 Encounter for screening for malignant neoplasm of vagina: Secondary | ICD-10-CM | POA: Diagnosis not present

## 2024-03-14 DIAGNOSIS — Z1231 Encounter for screening mammogram for malignant neoplasm of breast: Secondary | ICD-10-CM | POA: Diagnosis not present

## 2024-03-23 ENCOUNTER — Encounter: Payer: Self-pay | Admitting: Gastroenterology

## 2024-04-30 NOTE — Progress Notes (Unsigned)
 Martha Hendricks [id number] December 02, 1950   Chief Complaint:  Referring Provider: Sheryle Carwin, MD Primary GI MD:   HPI: Martha Hendricks is a 73 y.o. female with past medical history of *** who presents today for a complaint of change in bowel habits.    Just had televisit with [facility] on 12/20/2023.  History of GERD on omeprazole.  EGD with dilation 2021.  Has colonoscopy every 5 years.  History of colon cancer in 2 aunts and grandmother.  Last colonoscopy 2022.   Previous GI Procedures/Imaging   Colonoscopy 01/29/2021 (Dr. Golda, [facility]) - Perianal skin tags found on perianal exam.  - Two small polyps at the hepatic flexure and in the ascending colon, removed with a cold snare. Resected and retrieved.  - One diminutive polyp at the splenic flexure. Biopsied.  - Diverticulosis at the splenic flexure.  - External hemorrhoids. - Recall 5 years Path: A. COLON, ASCENDING, HEPATIC FLEXURE, SPLENIC FLEXURE, POLYPECTOMY:  - Tubular adenoma(s) without high-grade dysplasia or malignancy  - Sessile serrated polyp without cytologic dysplasia   EGD 12/20/2019 *Report unavailable  Path: FINAL PATHOLOGIC DIAGNOSIS MICROSCOPIC EXAMINATION AND DIAGNOSIS  A.  STOMACH, BIOPSY:      Mild chronic gastritis with reactive features.      No H. pylori on routine histology.      Few scattered lamina propria eosinophils.  B.  ESOPHAGUS, DISTAL, BIOPSY:      Benign squamous mucosa.      No intraepithelial eosinophils.  C.  ESOPHAGUS, PROXIMAL, BIOPSY:      Benign squamous mucosa.      No intraepithelial eosinophils.   Colonoscopy 03/18/2016 (Dr. Golda, [facility]) - The entire examined colon is normal.  - External hemorrhoids.  - Anal papilla( e) were hypertrophied. - No specimens collected  Past Medical History:  Diagnosis Date   Anxiety    Arthritis    Cancer (HCC)    thyroid  cancer   Depression    Hypothyroidism    Migraines     Past Surgical  History:  Procedure Laterality Date   ABDOMINAL HYSTERECTOMY     APPENDECTOMY  11/17/2000   BILATERAL SALPINGOOPHORECTOMY  11/17/2000   BIOPSY  01/29/2021   Procedure: BIOPSY;  Surgeon: Golda Claudis PENNER, MD;  [facility];  Service: Endoscopy;;   COLONOSCOPY N/A 03/18/2016   Procedure: COLONOSCOPY;  Surgeon: Claudis PENNER Golda, MD;  [facility];  Service: Endoscopy;  Laterality: N/A;  930   COLONOSCOPY N/A 01/29/2021   Procedure: COLONOSCOPY;  Surgeon: Golda Claudis PENNER, MD;  [facility];  Service: Endoscopy;  Laterality: N/A;  AM   ECTOPIC PREGNANCY SURGERY     ESOPHAGOGASTRODUODENOSCOPY (EGD) WITH ESOPHAGEAL DILATION     MASS EXCISION Left 01/15/2016   Procedure: EXCISION MASS LEFT MIDDLE FINGER;  Surgeon: Arley Curia, MD;  Location: [facility];  Service: Orthopedics;  Laterality: Left;   POLYPECTOMY  01/29/2021   Procedure: POLYPECTOMY;  Surgeon: Golda Claudis PENNER, MD;  [facility];  Service: Endoscopy;;   THYROIDECTOMY N/A 03/21/2015   Procedure: TOTAL THYROIDECTOMY;  Surgeon: Marlyce Finer, MD;  Location: [facility];  Service: ENT;  Laterality: N/A;   TONSILLECTOMY      Current Outpatient Medications  Medication Sig Dispense Refill   aspirin-acetaminophen -caffeine  (EXCEDRIN MIGRAINE) 250-250-65 MG tablet Take 1 tablet by mouth every 8 (eight) hours as needed for headache.      Calcium  Carbonate-Vit D-Min (CALCIUM  1200 PO) Take 1,200  mg by mouth daily.     cholecalciferol (VITAMIN D ) 1000 units tablet Take 1,000 Units by mouth daily.     citalopram  (CELEXA ) 20 MG tablet Take 20 mg by mouth daily.     COVID-19 mRNA Vac-TriS, Pfizer, (PFIZER-BIONT COVID-19 VAC-TRIS) SUSP injection Inject into the muscle. 0.3 mL 0   estradiol  (ESTRACE ) 0.5 MG tablet Take 0.5 mg by mouth daily.     levothyroxine  (SYNTHROID ) 100 MCG tablet Take 100 mcg by mouth daily before breakfast.     liothyronine (CYTOMEL) 5 MCG tablet Take 5 mcg by mouth every morning.      Misc Natural Products (GLUCOSAMINE CHOND COMPLEX/MSM) TABS Take 1-2 tablets by mouth daily.     Na Sulfate-K Sulfate-Mg Sulf (SUPREP BOWEL PREP  KIT) 17.5-3.13-1.6 GM/177ML SOLN Take 1 kit by mouth as directed. 354 mL 0   omeprazole (PRILOSEC) 20 MG capsule Take 20 mg by mouth at bedtime.     Polyvinyl Alcohol-Povidone PF (REFRESH) 1.4-0.6 % SOLN Place 1 drop into both eyes daily as needed (dry eyes).     No current facility-administered medications for this visit.    Allergies as of 05/01/2024 - Review Complete 01/29/2021  Allergen Reaction Noted   Morphine and codeine Nausea Only 09/05/2013    Family History  Problem Relation Age of Onset   Colon cancer Paternal Aunt    Colon cancer Paternal Grandmother     Social History   Tobacco Use   Smoking status: Never   Smokeless tobacco: Never  Vaping Use   Vaping status: Never Used  Substance Use Topics   Alcohol use: Yes    Comment: occ   Drug use: No     Review of Systems:    Constitutional: No weight loss, fever, chills, weakness or fatigue Eyes: No change in vision Ears, Nose, Throat:  No change in hearing or congestion Skin: No rash or itching Cardiovascular: No chest pain, chest pressure or palpitations   Respiratory: No SOB or cough Gastrointestinal: See HPI and otherwise negative Genitourinary: No dysuria or change in urinary frequency Neurological: No headache, dizziness or syncope Musculoskeletal: No new muscle or joint pain Hematologic: No bleeding or bruising    Physical Exam:  Vital signs: There were no vitals taken for this visit.  Constitutional: NAD, Well developed, Well nourished, alert and cooperative Head:  Normocephalic and atraumatic.  Eyes: No scleral icterus. Conjunctiva pink. Mouth: No oral lesions. Respiratory: Respirations even and unlabored. Lungs clear to auscultation bilaterally.  No wheezes, crackles, or rhonchi.  Cardiovascular:  Regular rate and rhythm. No murmurs. No peripheral  edema. Gastrointestinal:  Soft, nondistended, nontender. No rebound or guarding. Normal bowel sounds. No appreciable masses or hepatomegaly. Rectal:  Not performed.  Neurologic:  Alert and oriented x4;  grossly normal neurologically.  Skin:   Dry and intact without significant lesions or rashes. Psychiatric: Oriented to person, place and time. Demonstrates good judgement and reason without abnormal affect or behaviors.   RELEVANT LABS AND IMAGING: CBC    Component Value Date/Time   WBC 5.2 03/19/2015 1331   RBC 4.45 03/19/2015 1331   HGB 13.5 03/19/2015 1331   HCT 39.9 03/19/2015 1331   PLT 182 03/19/2015 1331   MCV 89.7 03/19/2015 1331   MCH 30.3 03/19/2015 1331   MCHC 33.8 03/19/2015 1331   RDW 12.9 03/19/2015 1331    CMP     Component Value Date/Time   NA 139 03/23/2015 0600   K 4.1 03/23/2015 0600   CL 102 03/23/2015 0600  CO2 30 03/23/2015 0600   GLUCOSE 95 03/23/2015 0600   BUN 8 03/23/2015 0600   CREATININE 0.75 03/23/2015 0600   CALCIUM  8.5 (L) 03/23/2015 0600   PROT 5.9 (L) 03/23/2015 0600   ALBUMIN 3.0 (L) 03/23/2015 0600   AST 18 03/23/2015 0600   ALT 15 03/23/2015 0600   ALKPHOS 39 03/23/2015 0600   BILITOT 0.3 03/23/2015 0600   GFRNONAA >60 03/23/2015 0600   GFRAA >60 03/23/2015 0600     Assessment/Plan:       Camie Furbish, PA-C [facility] 04/30/2024, 7:19 PM  Patient Care Team: Sheryle Carwin, MD as PCP - General (Internal Medicine)

## 2024-05-01 ENCOUNTER — Encounter: Payer: Self-pay | Admitting: Gastroenterology

## 2024-05-01 ENCOUNTER — Ambulatory Visit: Admitting: Gastroenterology

## 2024-05-01 ENCOUNTER — Other Ambulatory Visit (INDEPENDENT_AMBULATORY_CARE_PROVIDER_SITE_OTHER)

## 2024-05-01 VITALS — BP 108/60 | HR 76 | Ht 65.0 in | Wt 147.5 lb

## 2024-05-01 DIAGNOSIS — R194 Change in bowel habit: Secondary | ICD-10-CM

## 2024-05-01 DIAGNOSIS — R197 Diarrhea, unspecified: Secondary | ICD-10-CM | POA: Diagnosis not present

## 2024-05-01 DIAGNOSIS — M1990 Unspecified osteoarthritis, unspecified site: Secondary | ICD-10-CM | POA: Insufficient documentation

## 2024-05-01 DIAGNOSIS — R159 Full incontinence of feces: Secondary | ICD-10-CM | POA: Diagnosis not present

## 2024-05-01 DIAGNOSIS — Z800 Family history of malignant neoplasm of digestive organs: Secondary | ICD-10-CM

## 2024-05-01 DIAGNOSIS — Z860101 Personal history of adenomatous and serrated colon polyps: Secondary | ICD-10-CM

## 2024-05-01 DIAGNOSIS — R151 Fecal smearing: Secondary | ICD-10-CM

## 2024-05-01 LAB — CBC WITH DIFFERENTIAL/PLATELET
Basophils Absolute: 0 K/uL (ref 0.0–0.1)
Basophils Relative: 0.4 % (ref 0.0–3.0)
Eosinophils Absolute: 0.1 K/uL (ref 0.0–0.7)
Eosinophils Relative: 2.6 % (ref 0.0–5.0)
HCT: 44.3 % (ref 36.0–46.0)
Hemoglobin: 14.7 g/dL (ref 12.0–15.0)
Lymphocytes Relative: 44.5 % (ref 12.0–46.0)
Lymphs Abs: 2.4 K/uL (ref 0.7–4.0)
MCHC: 33.1 g/dL (ref 30.0–36.0)
MCV: 90.7 fl (ref 78.0–100.0)
Monocytes Absolute: 0.5 K/uL (ref 0.1–1.0)
Monocytes Relative: 8.9 % (ref 3.0–12.0)
Neutro Abs: 2.3 K/uL (ref 1.4–7.7)
Neutrophils Relative %: 43.6 % (ref 43.0–77.0)
Platelets: 178 K/uL (ref 150.0–400.0)
RBC: 4.88 Mil/uL (ref 3.87–5.11)
RDW: 13.8 % (ref 11.5–15.5)
WBC: 5.3 K/uL (ref 4.0–10.5)

## 2024-05-01 LAB — COMPREHENSIVE METABOLIC PANEL WITH GFR
ALT: 23 U/L (ref 0–35)
AST: 21 U/L (ref 0–37)
Albumin: 4.4 g/dL (ref 3.5–5.2)
Alkaline Phosphatase: 52 U/L (ref 39–117)
BUN: 11 mg/dL (ref 6–23)
CO2: 30 meq/L (ref 19–32)
Calcium: 9.1 mg/dL (ref 8.4–10.5)
Chloride: 101 meq/L (ref 96–112)
Creatinine, Ser: 0.72 mg/dL (ref 0.40–1.20)
GFR: 83.84 mL/min (ref >60.00)
Glucose, Bld: 101 mg/dL — ABNORMAL HIGH (ref 70–99)
Potassium: 4.1 meq/L (ref 3.5–5.1)
Sodium: 137 meq/L (ref 135–145)
Total Bilirubin: 0.5 mg/dL (ref 0.2–1.2)
Total Protein: 7.3 g/dL (ref 6.0–8.3)

## 2024-05-01 LAB — C-REACTIVE PROTEIN: CRP: <1 mg/dL (ref 0.5–20.0)

## 2024-05-01 NOTE — Patient Instructions (Addendum)
 Your provider has requested that you go to the basement level for lab work before leaving today. Press B on the elevator. The lab is located at the first door on the left as you exit the elevator.  Due to recent changes in healthcare laws, you may see the results of your imaging and laboratory studies on MyChart before your provider has had a chance to review them.  We understand that in some cases there may be results that are confusing or concerning to you. Not all laboratory results come back in the same time frame and the provider may be waiting for multiple results in order to interpret others.  Please give us  48 hours in order for your provider to thoroughly review all the results before contacting the office for clarification of your results.   Start a fiber supplement such as Benefiber, Citrucel, or  Metamucil.  Take 1 tablespoon once or twice daily to keep bowels regular if needed.  Follow up in 6-8 weeks.  Thank you for trusting me with your gastrointestinal care!   Camie Furbish, PA-C  _______________________________________________________  If your blood pressure at your visit was 140/90 or greater, please contact your primary care physician to follow up on this.  _______________________________________________________  If you are age 72 or older, your body mass index should be between 23-30. Your Body mass index is 24.55 kg/m. If this is out of the aforementioned range listed, please consider follow up with your Primary Care Provider.  If you are age 37 or younger, your body mass index should be between 19-25. Your Body mass index is 24.55 kg/m. If this is out of the aformentioned range listed, please consider follow up with your Primary Care Provider.   ________________________________________________________  The [facility] providers would like to encourage you to use MYCHART to communicate with providers for non-urgent requests or questions.  Due to long hold times on the  telephone, sending your provider a message by [facility] may be a faster and more efficient way to get a response.  Please allow 48 business hours for a response.  Please remember that this is for non-urgent requests.  _______________________________________________________

## 2024-05-02 LAB — TISSUE TRANSGLUTAMINASE ABS,IGG,IGA
(tTG) Ab, IgA: <1 U/mL
(tTG) Ab, IgG: <1 U/mL

## 2024-05-02 LAB — IGA: Immunoglobulin A: 322 mg/dL — ABNORMAL HIGH (ref 70–320)

## 2024-05-03 DIAGNOSIS — D3131 Benign neoplasm of right choroid: Secondary | ICD-10-CM | POA: Diagnosis not present

## 2024-05-04 ENCOUNTER — Ambulatory Visit: Payer: Self-pay | Admitting: Gastroenterology

## 2024-05-04 ENCOUNTER — Other Ambulatory Visit

## 2024-05-04 DIAGNOSIS — R194 Change in bowel habit: Secondary | ICD-10-CM | POA: Diagnosis not present

## 2024-05-04 DIAGNOSIS — R197 Diarrhea, unspecified: Secondary | ICD-10-CM | POA: Diagnosis not present

## 2024-05-04 NOTE — Progress Notes (Signed)
 Attending Physician's Attestation   I have reviewed the chart.   I agree with the Advanced Practitioner's note, impression, and recommendations with any updates as below. Hopefully things improve.  However, additional workup to consider would be SIBO evaluation and fecal elastase/fecal fat testing.  Empiric Xifaxan therapy for IBS/D could be considered if workup is unremarkable.  Patient symptoms continue, she will need colonoscopy with biopsies to rule out lymphocytic/collagenous/microscopic colitis.   Aloha Finner, MD [facility] Advanced Endoscopy Office # [phone]

## 2024-05-08 LAB — GASTROINTESTINAL PATHOGEN PNL
CampyloBacter Group: NOT DETECTED
Norovirus GI/GII: NOT DETECTED
Rotavirus A: NOT DETECTED
Salmonella species: NOT DETECTED
Shiga Toxin 1: NOT DETECTED
Shiga Toxin 2: NOT DETECTED
Shigella Species: NOT DETECTED
Vibrio Group: NOT DETECTED
Yersinia enterocolitica: NOT DETECTED

## 2024-05-08 LAB — CALPROTECTIN, FECAL: Calprotectin, Fecal: 68 ug/g (ref 0–120)

## 2024-05-08 LAB — CLOSTRIDIUM DIFFICILE CULTURE-FECAL

## 2024-05-11 NOTE — Telephone Encounter (Signed)
 Lab orders sent. Patient was advised.

## 2024-05-16 ENCOUNTER — Other Ambulatory Visit: Payer: Self-pay

## 2024-05-18 ENCOUNTER — Other Ambulatory Visit

## 2024-05-18 DIAGNOSIS — R194 Change in bowel habit: Secondary | ICD-10-CM

## 2024-05-18 DIAGNOSIS — R197 Diarrhea, unspecified: Secondary | ICD-10-CM

## 2024-05-22 LAB — FECAL FAT, QUALITATIVE
Fat Qual Neutral, Stl: NORMAL
Fat Qual Total, Stl: NORMAL

## 2024-05-24 ENCOUNTER — Ambulatory Visit: Payer: Self-pay | Admitting: Gastroenterology

## 2024-05-24 LAB — PANCREATIC ELASTASE, FECAL: Pancreatic Elastase-1, Stool: >800 ug/g (ref >200)

## 2024-06-11 NOTE — Telephone Encounter (Addendum)
 Received a call from patient, states she needs to speak with demita regarding scheduling. Please review and advise  Thank you

## 2024-06-21 ENCOUNTER — Encounter: Payer: Self-pay | Admitting: Gastroenterology

## 2024-06-21 ENCOUNTER — Ambulatory Visit: Admitting: Gastroenterology

## 2024-06-21 VITALS — BP 110/72 | HR 69 | Ht 65.0 in | Wt 150.4 lb

## 2024-06-21 DIAGNOSIS — R197 Diarrhea, unspecified: Secondary | ICD-10-CM

## 2024-06-21 DIAGNOSIS — Z860101 Personal history of adenomatous and serrated colon polyps: Secondary | ICD-10-CM

## 2024-06-21 DIAGNOSIS — Z800 Family history of malignant neoplasm of digestive organs: Secondary | ICD-10-CM

## 2024-06-21 DIAGNOSIS — R194 Change in bowel habit: Secondary | ICD-10-CM

## 2024-06-21 DIAGNOSIS — R151 Fecal smearing: Secondary | ICD-10-CM

## 2024-06-21 MED ORDER — NA SULFATE-K SULFATE-MG SULF 17.5-3.13-1.6 GM/177ML PO SOLN: 1.0000 | Freq: Once | ORAL | 0 refills | Status: AC

## 2024-06-21 NOTE — Progress Notes (Unsigned)
 Martha [facility] [id number] 1951/04/27   Chief Complaint:  Referring Provider: Sheryle Carwin, MD Primary GI MD: Dr. Wilhelmenia  HPI: [facility] is a 73 y.o. female with past medical history of arthritis, thyroid  cancer s/p thyroidectomy, anxiety/depression, hypothyroidism, migraines, prior appendectomy, hysterectomy who presents today for follow up.    Televisit with [facility] on 12/20/2023.  History of GERD on omeprazole.  EGD with dilation 2021.  Has colonoscopy every 5 years.  History of colon cancer in 2 aunts and grandmother.  Last colonoscopy 2022.   Seen in April for diarrhea, question possible viral infection, took probiotic and still not having formed bowel movements. Had normal rectal exam at that time.  Patient initially seen in our office 05/01/2024 for complaint of diarrhea and fecal seepage.  Noted to have onset of diarrhea around March following suspected viral illness.  Had tested negative for COVID and flu and was treated with azithromycin with improvement, though symptoms then returned.  At visit endorsed persistent diarrhea with bowel movements 1-3 times a day.  No rectal bleeding.  Had tried probiotics for couple months without improvement.  Endorse associated fecal urgency and incontinence/seepage.  Prior to symptom onset was having 1 formed bowel movement daily.  Labs were normal without evidence of infection or anemia, normal liver and kidney function, negative inflammatory markers, negative celiac disease.  Stool studies negative for infection.  Dr. Wilhelmenia suggested checking a fecal pancreatic elastase and fecal fat, as well as empiric treatment with Xifaxan for IBS-D if negative.  Fecal pancreatic elastase and fecal fat were normal.   Just a couple episodes of diarrhea since last visit Does go several times a day, and still has leakage randomly during the day, worse with straining Does pass some mucus, possible red tinge in toilet Not seeing  blood Urgency to get to the bathroom  No abd pain  No fever or chills No blood thinners No chest pain or shortness of breath Frequency of bms varies    Previous GI Procedures/Imaging   Colonoscopy 01/29/2021 (Dr. Golda, [facility]) - Perianal skin tags found on perianal exam.  - Two small polyps at the hepatic flexure and in the ascending colon, removed with a cold snare. Resected and retrieved.  - One diminutive polyp at the splenic flexure. Biopsied.  - Diverticulosis at the splenic flexure.  - External hemorrhoids. - Recall 5 years Path: A. COLON, ASCENDING, HEPATIC FLEXURE, SPLENIC FLEXURE, POLYPECTOMY:  - Tubular adenoma(s) without high-grade dysplasia or malignancy  - Sessile serrated polyp without cytologic dysplasia    EGD 12/20/2019 *Report unavailable   Path: FINAL PATHOLOGIC DIAGNOSIS MICROSCOPIC EXAMINATION AND DIAGNOSIS A.  STOMACH, BIOPSY:      Mild chronic gastritis with reactive features.      No H. pylori on routine histology.      Few scattered lamina propria eosinophils. B.  ESOPHAGUS, DISTAL, BIOPSY:      Benign squamous mucosa.      No intraepithelial eosinophils. C.  ESOPHAGUS, PROXIMAL, BIOPSY:      Benign squamous mucosa.      No intraepithelial eosinophils.    Colonoscopy 03/18/2016 (Dr. Golda, [facility]) - The entire examined colon is normal.  - External hemorrhoids.  - Anal papilla( e) were hypertrophied. - No specimens collected   Past Medical History:  Diagnosis Date   Cancer [facility])    thyroid  cancer   Colon polyps    Depression    Hyperlipidemia    Hypothyroidism  Migraines    Status post dilation of esophageal narrowing     Past Surgical History:  Procedure Laterality Date   ABDOMINAL HYSTERECTOMY     APPENDECTOMY  11/17/2000   BILATERAL SALPINGOOPHORECTOMY  11/17/2000   BIOPSY  01/29/2021   Procedure: BIOPSY;  Surgeon: Golda Claudis PENNER, MD;  [facility];  Service: Endoscopy;;   COLONOSCOPY  N/A 03/18/2016   Procedure: COLONOSCOPY;  Surgeon: Claudis PENNER Golda, MD;  [facility];  Service: Endoscopy;  Laterality: N/A;  930   COLONOSCOPY N/A 01/29/2021   Procedure: COLONOSCOPY;  Surgeon: Golda Claudis PENNER, MD;  [facility];  Service: Endoscopy;  Laterality: N/A;  AM   ECTOPIC PREGNANCY SURGERY     ESOPHAGOGASTRODUODENOSCOPY (EGD) WITH ESOPHAGEAL DILATION     MASS EXCISION Left 01/15/2016   Procedure: EXCISION MASS LEFT MIDDLE FINGER;  Surgeon: Arley Curia, MD;  Location: [facility];  Service: Orthopedics;  Laterality: Left;   POLYPECTOMY  01/29/2021   Procedure: POLYPECTOMY;  Surgeon: Golda Claudis PENNER, MD;  [facility];  Service: Endoscopy;;   THYROIDECTOMY N/A 03/21/2015   Procedure: TOTAL THYROIDECTOMY;  Surgeon: Marlyce Finer, MD;  Location: [facility];  Service: ENT;  Laterality: N/A;   TONSILLECTOMY      Current Outpatient Medications  Medication Sig Dispense Refill   aspirin-acetaminophen -caffeine  (EXCEDRIN MIGRAINE) 250-250-65 MG tablet Take 1 tablet by mouth every 8 (eight) hours as needed for headache.      Calcium  Carbonate-Vit D-Min (CALCIUM  1200 PO) Take 1,200 mg by mouth daily.     cholecalciferol (VITAMIN D ) 1000 units tablet Take 1,000 Units by mouth daily.     citalopram  (CELEXA ) 20 MG tablet Take 20 mg by mouth daily.     COVID-19 mRNA Vac-TriS, Pfizer, (PFIZER-BIONT COVID-19 VAC-TRIS) SUSP injection Inject into the muscle. 0.3 mL 0   diphenoxylate-atropine (LOMOTIL) 2.5-0.025 MG tablet Take 1 tablet by mouth as needed for diarrhea or loose stools.     estradiol  (ESTRACE ) 0.5 MG tablet Take 0.5 mg by mouth daily.     Glucosamine-Chondroitin (COSAMIN DS) 500-400 MG CAPS 400 mg daily.     latanoprost (XALATAN) 0.005 % ophthalmic solution SMARTSIG:In Eye(s)     levothyroxine  (SYNTHROID ) 100 MCG tablet Take 100 mcg by mouth daily before breakfast.     liothyronine (CYTOMEL) 5 MCG tablet Take 5 mcg by mouth every morning.      Multiple Vitamins-Minerals (CENTRUM SILVER 50+WOMEN PO) Take by mouth daily.     Na Sulfate-K Sulfate-Mg Sulf (SUPREP BOWEL PREP  KIT) 17.5-3.13-1.6 GM/177ML SOLN Take 1 kit by mouth as directed. 354 mL 0   omeprazole (PRILOSEC) 20 MG capsule Take 20 mg by mouth at bedtime.     Polyvinyl Alcohol-Povidone PF (REFRESH) 1.4-0.6 % SOLN Place 1 drop into both eyes daily as needed (dry eyes).     rosuvastatin (CRESTOR) 10 MG tablet Take 10 mg by mouth at bedtime.     tretinoin (RETIN-A) 0.1 % cream Apply 0.1 % topically at bedtime.     Varenicline Tartrate (TYRVAYA) 0.03 MG/ACT SOLN Place 0.03 mg into alternate nostrils as needed.     No current facility-administered medications for this visit.    Allergies as of 06/21/2024 - Review Complete 06/21/2024  Allergen Reaction Noted   Morphine and codeine Nausea Only 09/05/2013    Family History  Problem Relation Age of Onset   Prostate cancer Father    Prostate cancer Brother    Colon polyps Brother  Stomach cancer Maternal Grandmother    Colon cancer Paternal Grandmother    Colon cancer Paternal Aunt     Social History   Tobacco Use   Smoking status: Never   Smokeless tobacco: Never  Vaping Use   Vaping status: Never Used  Substance Use Topics   Alcohol use: Yes    Comment: occ   Drug use: No     Review of Systems:    Constitutional: No weight loss, fever, chills, weakness or fatigue Eyes: No change in vision Ears, Nose, Throat:  No change in hearing or congestion Skin: No rash or itching Cardiovascular: No chest pain, chest pressure or palpitations   Respiratory: No SOB or cough Gastrointestinal: See HPI and otherwise negative Genitourinary: No dysuria or change in urinary frequency Neurological: No headache, dizziness or syncope Musculoskeletal: No new muscle or joint pain Hematologic: No bleeding or bruising    Physical Exam:  Vital signs: BP 110/72   Pulse 69   Ht 5' 5 (1.651 m)   Wt 150 lb 6 oz (68.2 kg)   BMI  25.02 kg/m   Constitutional: NAD, Well developed, Well nourished, alert and cooperative Head:  Normocephalic and atraumatic.  Eyes: No scleral icterus. Conjunctiva pink. Mouth: No oral lesions. Respiratory: Respirations even and unlabored. Lungs clear to auscultation bilaterally.  No wheezes, crackles, or rhonchi.  Cardiovascular:  Regular rate and rhythm. No murmurs. No peripheral edema. Gastrointestinal:  Soft, nondistended, nontender. No rebound or guarding. Normal bowel sounds. No appreciable masses or hepatomegaly. Rectal:  Not performed.  Neurologic:  Alert and oriented x4;  grossly normal neurologically.  Skin:   Dry and intact without significant lesions or rashes. Psychiatric: Oriented to person, place and time. Demonstrates good judgement and reason without abnormal affect or behaviors.   RELEVANT LABS AND IMAGING: CBC    Component Value Date/Time   WBC 5.3 05/01/2024 1421   RBC 4.88 05/01/2024 1421   HGB 14.7 05/01/2024 1421   HCT 44.3 05/01/2024 1421   PLT 178.0 05/01/2024 1421   MCV 90.7 05/01/2024 1421   MCH 30.3 03/19/2015 1331   MCHC 33.1 05/01/2024 1421   RDW 13.8 05/01/2024 1421   LYMPHSABS 2.4 05/01/2024 1421   MONOABS 0.5 05/01/2024 1421   EOSABS 0.1 05/01/2024 1421   BASOSABS 0.0 05/01/2024 1421    CMP     Component Value Date/Time   NA 137 05/01/2024 1421   K 4.1 05/01/2024 1421   CL 101 05/01/2024 1421   CO2 30 05/01/2024 1421   GLUCOSE 101 (H) 05/01/2024 1421   BUN 11 05/01/2024 1421   CREATININE 0.72 05/01/2024 1421   CALCIUM  9.1 05/01/2024 1421   PROT 7.3 05/01/2024 1421   ALBUMIN 4.4 05/01/2024 1421   AST 21 05/01/2024 1421   ALT 23 05/01/2024 1421   ALKPHOS 52 05/01/2024 1421   BILITOT 0.5 05/01/2024 1421   GFRNONAA >60 03/23/2015 0600   GFRAA >60 03/23/2015 0600     Assessment/Plan:    Colonoscopy with Dr. Wilhelmenia    Camie Furbish, PA-C [facility] 06/21/2024, 10:35 AM  Patient Care Team: Martha Carwin, MD as  PCP - General (Internal Medicine)

## 2024-06-21 NOTE — Patient Instructions (Addendum)
 _______________________________________________________  If your blood pressure at your visit was 140/90 or greater, please contact your primary care physician to follow up on this.  _______________________________________________________  If you are age 72 or older, your body mass index should be between 23-30. Your Body mass index is 25.02 kg/m. If this is out of the aforementioned range listed, please consider follow up with your Primary Care Provider.  If you are age 63 or younger, your body mass index should be between 19-25. Your Body mass index is 25.02 kg/m. If this is out of the aformentioned range listed, please consider follow up with your Primary Care Provider.   ________________________________________________________  The [facility] providers would like to encourage you to use MYCHART to communicate with providers for non-urgent requests or questions.  Due to long hold times on the telephone, sending your provider a message by [facility] may be a faster and more efficient way to get a response.  Please allow 48 business hours for a response.  Please remember that this is for non-urgent requests.  _______________________________________________________  [facility] is using a team-based approach to care.  Your team is made up of your doctor and two to three APPS. Our APPS (Nurse Practitioners and Physician Assistants) work with your physician to ensure care continuity for you. They are fully qualified to address your health concerns and develop a treatment plan. They communicate directly with your gastroenterologist to care for you. Seeing the Advanced Practice Practitioners on your physician's team can help you by facilitating care more promptly, often allowing for earlier appointments, access to diagnostic testing, procedures, and other specialty referrals.   We have sent the following medications to your pharmacy for you to pick up at your convenience: Suprep  You have  been scheduled for an appointment with Camie Furbish PA-C on 09-05-24 at 10am . Please arrive 10 minutes early for your appointment.  You have been scheduled for a colonoscopy. Please follow written instructions given to you at your visit today.   If you use inhalers (even only as needed), please bring them with you on the day of your procedure.  DO NOT TAKE 7 DAYS PRIOR TO TEST- Trulicity (dulaglutide) Ozempic, Wegovy (semaglutide) Mounjaro (tirzepatide) Bydureon Bcise (exanatide extended release)  DO NOT TAKE 1 DAY PRIOR TO YOUR TEST Rybelsus (semaglutide) Adlyxin (lixisenatide) Victoza (liraglutide) Byetta (exanatide) ___________________________________________________________________________  It was a pleasure to see you today!  Thank you for trusting me with your gastrointestinal care!

## 2024-06-22 ENCOUNTER — Encounter: Payer: Self-pay | Admitting: Gastroenterology

## 2024-06-23 NOTE — Progress Notes (Signed)
 Attending Physician's Attestation   I have reviewed the chart.   I agree with the Advanced Practitioner's note, impression, and recommendations with any updates as below. At this point, I agree that endoscopic evaluation with biopsies to rule out lymphocytic/collagenous colitis makes sense and with her history of prior adenomas we will ensure nothing else is developed or progressed.  Pelvic floor physical therapy seems like a good plan and can consider anorectal manometry in future as well.  Will move forward with colonoscopy.  IBS/D therapy will be considered if negative workup endoscopically.   Aloha Finner, MD [facility] Advanced Endoscopy Office # [phone]

## 2024-07-18 ENCOUNTER — Encounter: Payer: Self-pay | Admitting: Gastroenterology

## 2024-07-25 ENCOUNTER — Ambulatory Visit (AMBULATORY_SURGERY_CENTER): Admitting: Gastroenterology

## 2024-07-25 ENCOUNTER — Encounter: Admitting: Gastroenterology

## 2024-07-25 ENCOUNTER — Encounter: Payer: Self-pay | Admitting: Gastroenterology

## 2024-07-25 VITALS — BP 161/89 | HR 63 | Temp 97.5°F | Resp 17 | Ht 65.0 in | Wt 150.0 lb

## 2024-07-25 DIAGNOSIS — Z860101 Personal history of adenomatous and serrated colon polyps: Secondary | ICD-10-CM

## 2024-07-25 DIAGNOSIS — R197 Diarrhea, unspecified: Secondary | ICD-10-CM

## 2024-07-25 DIAGNOSIS — K641 Second degree hemorrhoids: Secondary | ICD-10-CM

## 2024-07-25 DIAGNOSIS — K5730 Diverticulosis of large intestine without perforation or abscess without bleeding: Secondary | ICD-10-CM

## 2024-07-25 DIAGNOSIS — D122 Benign neoplasm of ascending colon: Secondary | ICD-10-CM

## 2024-07-25 DIAGNOSIS — R151 Fecal smearing: Secondary | ICD-10-CM

## 2024-07-25 DIAGNOSIS — K562 Volvulus: Secondary | ICD-10-CM | POA: Diagnosis not present

## 2024-07-25 DIAGNOSIS — Z800 Family history of malignant neoplasm of digestive organs: Secondary | ICD-10-CM | POA: Diagnosis not present

## 2024-07-25 DIAGNOSIS — R194 Change in bowel habit: Secondary | ICD-10-CM

## 2024-07-25 DIAGNOSIS — K644 Residual hemorrhoidal skin tags: Secondary | ICD-10-CM

## 2024-07-25 DIAGNOSIS — F32A Depression, unspecified: Secondary | ICD-10-CM | POA: Diagnosis not present

## 2024-07-25 DIAGNOSIS — E785 Hyperlipidemia, unspecified: Secondary | ICD-10-CM | POA: Diagnosis not present

## 2024-07-25 DIAGNOSIS — E039 Hypothyroidism, unspecified: Secondary | ICD-10-CM | POA: Diagnosis not present

## 2024-07-25 MED ORDER — SODIUM CHLORIDE 0.9 % IV SOLN: 500.0000 mL | Freq: Once | INTRAVENOUS | Status: DC

## 2024-07-25 NOTE — Progress Notes (Signed)
 Called to room to assist during endoscopic procedure.  Patient ID and intended procedure confirmed with present staff. Received instructions for my participation in the procedure from the performing physician.

## 2024-07-25 NOTE — Progress Notes (Signed)
 GASTROENTEROLOGY PROCEDURE H&P NOTE   Primary Care Physician: Sheryle Carwin, MD  HPI: [facility] is a 73 y.o. female who presents for Colonoscopy for change in bowel habits and diarrhea.  Past Medical History:  Diagnosis Date   Cancer [facility])    thyroid  cancer   Colon polyps    Depression    Hyperlipidemia    Hypothyroidism    Migraines    Status post dilation of esophageal narrowing    Past Surgical History:  Procedure Laterality Date   ABDOMINAL HYSTERECTOMY     APPENDECTOMY  11/17/2000   BILATERAL SALPINGOOPHORECTOMY  11/17/2000   BIOPSY  01/29/2021   Procedure: BIOPSY;  Surgeon: Golda Claudis PENNER, MD;  [facility];  Service: Endoscopy;;   COLONOSCOPY N/A 03/18/2016   Procedure: COLONOSCOPY;  Surgeon: Claudis PENNER Golda, MD;  [facility];  Service: Endoscopy;  Laterality: N/A;  930   COLONOSCOPY N/A 01/29/2021   Procedure: COLONOSCOPY;  Surgeon: Golda Claudis PENNER, MD;  [facility];  Service: Endoscopy;  Laterality: N/A;  AM   ECTOPIC PREGNANCY SURGERY     ESOPHAGOGASTRODUODENOSCOPY (EGD) WITH ESOPHAGEAL DILATION     MASS EXCISION Left 01/15/2016   Procedure: EXCISION MASS LEFT MIDDLE FINGER;  Surgeon: Arley Curia, MD;  Location: [facility];  Service: Orthopedics;  Laterality: Left;   POLYPECTOMY  01/29/2021   Procedure: POLYPECTOMY;  Surgeon: Golda Claudis PENNER, MD;  [facility];  Service: Endoscopy;;   THYROIDECTOMY N/A 03/21/2015   Procedure: TOTAL THYROIDECTOMY;  Surgeon: Marlyce Finer, MD;  Location: [facility] OR;  Service: ENT;  Laterality: N/A;   TONSILLECTOMY     Current Outpatient Medications  Medication Sig Dispense Refill   aspirin-acetaminophen -caffeine  (EXCEDRIN MIGRAINE) 250-250-65 MG tablet Take 1 tablet by mouth every 8 (eight) hours as needed for headache.      Calcium  Carbonate-Vit D-Min (CALCIUM  1200 PO) Take 1,200 mg by mouth daily.     cholecalciferol (VITAMIN D ) 1000 units tablet Take 1,000 Units  by mouth daily.     citalopram  (CELEXA ) 20 MG tablet Take 20 mg by mouth daily.     COVID-19 mRNA Vac-TriS, Pfizer, (PFIZER-BIONT COVID-19 VAC-TRIS) SUSP injection Inject into the muscle. 0.3 mL 0   diphenoxylate-atropine (LOMOTIL) 2.5-0.025 MG tablet Take 1 tablet by mouth as needed for diarrhea or loose stools.     estradiol  (ESTRACE ) 0.5 MG tablet Take 0.5 mg by mouth daily.     Glucosamine-Chondroitin (COSAMIN DS) 500-400 MG CAPS 400 mg daily.     latanoprost (XALATAN) 0.005 % ophthalmic solution SMARTSIG:In Eye(s)     levothyroxine  (SYNTHROID ) 100 MCG tablet Take 100 mcg by mouth daily before breakfast.     liothyronine (CYTOMEL) 5 MCG tablet Take 5 mcg by mouth every morning.     Multiple Vitamins-Minerals (CENTRUM SILVER 50+WOMEN PO) Take by mouth daily.     omeprazole (PRILOSEC) 20 MG capsule Take 20 mg by mouth at bedtime.     Polyvinyl Alcohol-Povidone PF (REFRESH) 1.4-0.6 % SOLN Place 1 drop into both eyes daily as needed (dry eyes).     rosuvastatin (CRESTOR) 10 MG tablet Take 10 mg by mouth at bedtime.     tretinoin (RETIN-A) 0.1 % cream Apply 0.1 % topically at bedtime.     Varenicline Tartrate (TYRVAYA) 0.03 MG/ACT SOLN Place 0.03 mg into alternate nostrils as needed.     No current facility-administered medications for this visit.    Current Outpatient Medications:    aspirin-acetaminophen -caffeine  (EXCEDRIN  MIGRAINE) 250-250-65 MG tablet, Take 1 tablet by mouth every 8 (eight) hours as needed for headache. , Disp: , Rfl:    Calcium  Carbonate-Vit D-Min (CALCIUM  1200 PO), Take 1,200 mg by mouth daily., Disp: , Rfl:    cholecalciferol (VITAMIN D ) 1000 units tablet, Take 1,000 Units by mouth daily., Disp: , Rfl:    citalopram  (CELEXA ) 20 MG tablet, Take 20 mg by mouth daily., Disp: , Rfl:    COVID-19 mRNA Vac-TriS, Pfizer, (PFIZER-BIONT COVID-19 VAC-TRIS) SUSP injection, Inject into the muscle., Disp: 0.3 mL, Rfl: 0   diphenoxylate-atropine (LOMOTIL) 2.5-0.025 MG tablet, Take 1  tablet by mouth as needed for diarrhea or loose stools., Disp: , Rfl:    estradiol  (ESTRACE ) 0.5 MG tablet, Take 0.5 mg by mouth daily., Disp: , Rfl:    Glucosamine-Chondroitin (COSAMIN DS) 500-400 MG CAPS, 400 mg daily., Disp: , Rfl:    latanoprost (XALATAN) 0.005 % ophthalmic solution, SMARTSIG:In Eye(s), Disp: , Rfl:    levothyroxine  (SYNTHROID ) 100 MCG tablet, Take 100 mcg by mouth daily before breakfast., Disp: , Rfl:    liothyronine (CYTOMEL) 5 MCG tablet, Take 5 mcg by mouth every morning., Disp: , Rfl:    Multiple Vitamins-Minerals (CENTRUM SILVER 50+WOMEN PO), Take by mouth daily., Disp: , Rfl:    omeprazole (PRILOSEC) 20 MG capsule, Take 20 mg by mouth at bedtime., Disp: , Rfl:    Polyvinyl Alcohol-Povidone PF (REFRESH) 1.4-0.6 % SOLN, Place 1 drop into both eyes daily as needed (dry eyes)., Disp: , Rfl:    rosuvastatin (CRESTOR) 10 MG tablet, Take 10 mg by mouth at bedtime., Disp: , Rfl:    tretinoin (RETIN-A) 0.1 % cream, Apply 0.1 % topically at bedtime., Disp: , Rfl:    Varenicline Tartrate (TYRVAYA) 0.03 MG/ACT SOLN, Place 0.03 mg into alternate nostrils as needed., Disp: , Rfl:  Allergies  Allergen Reactions   Morphine And Codeine Nausea Only   Family History  Problem Relation Age of Onset   Prostate cancer Father    Prostate cancer Brother    Colon polyps Brother    Stomach cancer Maternal Grandmother    Colon cancer Paternal Grandmother    Colon cancer Paternal Aunt    Social History   Socioeconomic History   Marital status: Married    Spouse name: Not on file   Number of children: 2   Years of education: Not on file   Highest education level: Not on file  Occupational History   Not on file  Tobacco Use   Smoking status: Never   Smokeless tobacco: Never  Vaping Use   Vaping status: Never Used  Substance and Sexual Activity   Alcohol use: Yes    Comment: occ   Drug use: No   Sexual activity: Yes  Other Topics Concern   Not on file  Social History  Narrative   Not on file   Social Drivers of Health   Financial Resource Strain: Not on file  Food Insecurity: Not on file  Transportation Needs: Not on file  Physical Activity: Not on file  Stress: Not on file  Social Connections: Not on file  Intimate Partner Violence: Not on file    Physical Exam: There were no vitals filed for this visit. There is no height or weight on file to calculate BMI. GEN: NAD EYE: Sclerae anicteric ENT: MMM CV: Non-tachycardic GI: Soft, NT/ND NEURO:  Alert & Oriented x 3  Lab Results: No results for input(s): WBC, HGB, HCT, PLT in the last 72  hours. BMET No results for input(s): NA, K, CL, CO2, GLUCOSE, BUN, CREATININE, CALCIUM  in the last 72 hours. LFT No results for input(s): PROT, ALBUMIN, AST, ALT, ALKPHOS, BILITOT, BILIDIR, IBILI in the last 72 hours. PT/INR No results for input(s): LABPROT, INR in the last 72 hours.   Impression / Plan: This is a 73 y.o.female who presents for Colonoscopy for change in bowel habits and diarrhea.  The risks and benefits of endoscopic evaluation/treatment were discussed with the patient and/or family; these include but are not limited to the risk of perforation, infection, bleeding, missed lesions, lack of diagnosis, severe illness requiring hospitalization, as well as anesthesia and sedation related illnesses.  The patient's history has been reviewed, patient examined, no change in status, and deemed stable for procedure.  The patient and/or family is agreeable to proceed.    Aloha Finner, MD [facility] Advanced Endoscopy Office # [phone]

## 2024-07-25 NOTE — Op Note (Signed)
 [facility] Patient Name: Martha Hendricks Procedure Date: 07/25/2024 10:46 AM MRN: [id number] Endoscopist: Aloha Finner , MD, [id number] Age: 73 Referring MD:  Date of Birth: 30-Jan-1951 Gender: Female Account #: [id number] Procedure:                Colonoscopy Indications:              High risk colon cancer surveillance: Personal                            history of colonic polyps, Incidental - Chronic                            diarrhea Medicines:                Monitored Anesthesia Care Procedure:                Pre-Anesthesia Assessment:                           - Prior to the procedure, a History and Physical                            was performed, and patient medications and                            allergies were reviewed. The patient's tolerance of                            previous anesthesia was also reviewed. The risks                            and benefits of the procedure and the sedation                            options and risks were discussed with the patient.                            All questions were answered, and informed consent                            was obtained. Prior Anticoagulants: The patient has                            taken no anticoagulant or antiplatelet agents                            except for NSAID medication. ASA Grade Assessment:                            II - A patient with mild systemic disease. After                            reviewing the risks and benefits, the patient was  deemed in satisfactory condition to undergo the                            procedure.                           After obtaining informed consent, the colonoscope                            was passed under direct vision. Throughout the                            procedure, the patient's blood pressure, pulse, and                            oxygen saturations were monitored continuously. The                             Olympus Scope SN: [id number] was introduced through                            the anus and advanced to the the cecum, identified                            by appendiceal orifice and ileocecal valve. The                            colonoscopy was somewhat difficult due to a                            redundant colon and significant looping. Successful                            completion of the procedure was aided by changing                            the patient's position, using manual pressure,                            straightening and shortening the scope to obtain                            bowel loop reduction and using scope torsion. The                            patient tolerated the procedure. The quality of the                            bowel preparation was adequate. The ileocecal                            valve, appendiceal orifice, and rectum were  photographed. Scope In: 11:00:51 AM Scope Out: 11:20:50 AM Scope Withdrawal Time: 0 hours 14 minutes 30 seconds  Total Procedure Duration: 0 hours 19 minutes 59 seconds  Findings:                 Skin tags were found on perianal exam.                           The digital rectal exam findings include                            hemorrhoids. Pertinent negatives include no                            palpable rectal lesions.                           The colon (entire examined portion) was                            significantly redundant leading to looping.                           Three sessile polyps were found in the ascending                            colon. The polyps were 2 to 8 mm in size. These                            polyps were removed with a cold snare. Resection                            and retrieval were complete.                           A single small-mouthed diverticulum was found in                            the splenic flexure.                           Normal mucosa  was found in the entire colon                            otherwise. Biopsies for histology were taken with a                            cold forceps from the entire colon for evaluation                            of microscopic colitis.                           Non-bleeding non-thrombosed external and internal  hemorrhoids were found during digital exam and                            during endoscopy. The hemorrhoids were Grade II                            (internal hemorrhoids that prolapse but reduce                            spontaneously). Complications:            No immediate complications. Estimated Blood Loss:     Estimated blood loss was minimal. Impression:               - Perianal skin tags found on perianal exam.                            Hemorrhoids found on digital rectal exam.                           - Redundant colon leading to significant looping.                           - Three 2 to 8 mm polyps in the ascending colon,                            removed with a cold snare. Resected and retrieved.                           - Diverticulosis at the splenic flexure.                           - Normal mucosa in the entire examined colon                            otherwise. Biopsied.                           - Non-bleeding non-thrombosed external and internal                            hemorrhoids. Recommendation:           - The patient will be observed post-procedure,                            until all discharge criteria are met.                           - Discharge patient to home.                           - Patient has a contact number available for                            emergencies. The signs and symptoms of potential  delayed complications were discussed with the                            patient. Return to normal activities tomorrow.                            Written discharge instructions were  provided to the                            patient.                           - High fiber diet.                           - Use FiberCon 1-2 tablets PO daily.                           - Continue present medications.                           - Await pathology results.                           - Repeat colonoscopy in likely 3 years for                            surveillance based on pathology results.                           - The findings and recommendations were discussed                            with the patient.                           - The findings and recommendations were discussed                            with the designated responsible adult. Aloha Finner, MD 07/25/2024 11:25:54 AM

## 2024-07-25 NOTE — Patient Instructions (Signed)
 Educational handout provided to patient related to Hemorrhoids, Polyps, Diverticulosis, and high fiber diet  High fiber diet  Continue present medications  Awaiting pathology results  YOU HAD AN ENDOSCOPIC PROCEDURE TODAY AT THE [facility]:   Refer to the procedure report that was given to you for any specific questions about what was found during the examination.  If the procedure report does not answer your questions, please call your gastroenterologist to clarify.  If you requested that your care partner not be given the details of your procedure findings, then the procedure report has been included in a sealed envelope for you to review at your convenience later.  YOU SHOULD EXPECT: Some feelings of bloating in the abdomen. Passage of more gas than usual.  Walking can help get rid of the air that was put into your GI tract during the procedure and reduce the bloating. If you had a lower endoscopy (such as a colonoscopy or flexible sigmoidoscopy) you may notice spotting of blood in your stool or on the toilet paper. If you underwent a bowel prep for your procedure, you may not have a normal bowel movement for a few days.  Please Note:  You might notice some irritation and congestion in your nose or some drainage.  This is from the oxygen used during your procedure.  There is no need for concern and it should clear up in a day or so.  SYMPTOMS TO REPORT IMMEDIATELY:  Following lower endoscopy (colonoscopy or flexible sigmoidoscopy):  Excessive amounts of blood in the stool  Significant tenderness or worsening of abdominal pains  Swelling of the abdomen that is new, acute  Fever of 100F or higher  For urgent or emergent issues, a gastroenterologist can be reached at any hour by calling [phone]. Do not use MyChart messaging for urgent concerns.    DIET:  We do recommend a small meal at first, but then you may proceed to your regular diet.  Drink plenty of fluids but  you should avoid alcoholic beverages for 24 hours.  ACTIVITY:  You should plan to take it easy for the rest of today and you should NOT DRIVE or use heavy machinery until tomorrow (because of the sedation medicines used during the test).    FOLLOW UP: Our staff will call the number listed on your records the next business day following your procedure.  We will call around 7:15- 8:00 am to check on you and address any questions or concerns that you may have regarding the information given to you following your procedure. If we do not reach you, we will leave a message.     If any biopsies were taken you will be contacted by phone or by letter within the next 1-3 weeks.  Please call us  at [phone] if you have not heard about the biopsies in 3 weeks.    SIGNATURES/CONFIDENTIALITY: You and/or your care partner have signed paperwork which will be entered into your electronic medical record.  These signatures attest to the fact that that the information above on your After Visit Summary has been reviewed and is understood.  Full responsibility of the confidentiality of this discharge information lies with you and/or your care-partner.

## 2024-07-25 NOTE — Progress Notes (Signed)
 Levsin and Simethicone given. Patient reports pain 1 out of 10.

## 2024-07-25 NOTE — Progress Notes (Signed)
 Vss nad trans to pacu

## 2024-07-26 ENCOUNTER — Other Ambulatory Visit (HOSPITAL_COMMUNITY)
Admission: RE | Admit: 2024-07-26 | Discharge: 2024-07-26 | Disposition: A | Discharge status: Home / Self Care | Payer: Self-pay | Source: Ambulatory Visit | Attending: Medical Genetics | Admitting: Medical Genetics

## 2024-07-26 ENCOUNTER — Telehealth: Payer: Self-pay

## 2024-07-26 DIAGNOSIS — Z006 Encounter for examination for normal comparison and control in clinical research program: Secondary | ICD-10-CM | POA: Insufficient documentation

## 2024-07-26 NOTE — Telephone Encounter (Signed)
  Follow up Call-     07/25/2024   10:01 AM  Call back number  Post procedure Call Back phone  # [phone]  Permission to leave phone message Yes     Patient questions:  Do you have a fever, pain , or abdominal swelling? No. Pain Score  0 *  Have you tolerated food without any problems? No.  Have you been able to return to your normal activities? No.  Do you have any questions about your discharge instructions: Diet   No. Medications  No. Follow up visit  No.  Do you have questions or concerns about your Care? No.  Actions: * If pain score is 4 or above: No action needed, pain <4.

## 2024-07-30 LAB — SURGICAL PATHOLOGY

## 2024-07-31 ENCOUNTER — Encounter: Admitting: Gastroenterology

## 2024-08-01 ENCOUNTER — Ambulatory Visit: Payer: Self-pay | Admitting: Gastroenterology

## 2024-08-04 LAB — GENECONNECT MOLECULAR SCREEN: Genetic Analysis Overall Interpretation: NEGATIVE

## 2024-09-05 ENCOUNTER — Ambulatory Visit: Admitting: Gastroenterology

## 2024-09-20 ENCOUNTER — Encounter: Payer: Self-pay | Admitting: Gastroenterology

## 2024-09-20 ENCOUNTER — Ambulatory Visit: Admitting: Gastroenterology

## 2024-09-20 VITALS — BP 108/74 | HR 71 | Ht 65.0 in | Wt 147.1 lb

## 2024-09-20 DIAGNOSIS — R151 Fecal smearing: Secondary | ICD-10-CM | POA: Diagnosis not present

## 2024-09-20 DIAGNOSIS — Z860101 Personal history of adenomatous and serrated colon polyps: Secondary | ICD-10-CM

## 2024-09-20 DIAGNOSIS — K580 Irritable bowel syndrome with diarrhea: Secondary | ICD-10-CM | POA: Diagnosis not present

## 2024-09-20 NOTE — Progress Notes (Signed)
 Attending Physician's Attestation   I have reviewed the chart.   I agree with the Advanced Practitioner's note, impression, and recommendations with any updates as below.    Corliss Parish, MD [facility] Advanced Endoscopy Office # [phone]

## 2024-09-20 NOTE — Patient Instructions (Addendum)
 You will be due for repeat colonoscopy 07/2027. We will reach out to you with a reminder around that time.   You may use FiberCon 1-2 tablets daily as fiber supplementation. Alternatively, you may continue using Benefiber.   Thank you for trusting me with your gastrointestinal care!   Camie Furbish, PA-C  _______________________________________________________  If your blood pressure at your visit was 140/90 or greater, please contact your primary care physician to follow up on this.  _______________________________________________________  If you are age 59 or older, your body mass index should be between 23-30. Your Body mass index is 24.48 kg/m. If this is out of the aforementioned range listed, please consider follow up with your Primary Care Provider.  If you are age 50 or younger, your body mass index should be between 19-25. Your Body mass index is 24.48 kg/m. If this is out of the aformentioned range listed, please consider follow up with your Primary Care Provider.   ________________________________________________________  The [facility] providers would like to encourage you to use MYCHART to communicate with providers for non-urgent requests or questions.  Due to long hold times on the telephone, sending your provider a message by [facility] may be a faster and more efficient way to get a response.  Please allow 48 business hours for a response.  Please remember that this is for non-urgent requests.  _______________________________________________________  [facility] is using a team-based approach to care.  Your team is made up of your doctor and two to three APPS. Our APPS (Nurse Practitioners and Physician Assistants) work with your physician to ensure care continuity for you. They are fully qualified to address your health concerns and develop a treatment plan. They communicate directly with your gastroenterologist to care for you. Seeing the Advanced Practice  Practitioners on your physician's team can help you by facilitating care more promptly, often allowing for earlier appointments, access to diagnostic testing, procedures, and other specialty referrals.

## 2024-09-20 NOTE — Progress Notes (Signed)
 Martha Hendricks [id number] [birth year]   Chief Complaint: Follow up  Referring Provider: Sheryle Carwin, MD Primary GI MD: Dr. Wilhelmenia   HPI: Martha Hendricks is a 73 y.o. female with past medical history of  arthritis, thyroid  cancer s/p thyroidectomy, anxiety/depression, hypothyroidism, migraines, prior appendectomy, hysterectomy who presents today for follow up.    Televisit with [facility] on 12/20/2023.  History of GERD on omeprazole.  EGD with dilation 2021.  Has colonoscopy every 5 years.  History of colon cancer in 2 aunts and grandmother.  Last colonoscopy 2022.   Seen in April for diarrhea, question possible viral infection, took probiotic and still not having formed bowel movements. Had normal rectal exam at that time.   Patient initially seen in our office 05/01/2024 for complaint of diarrhea and fecal seepage.  Noted to have onset of diarrhea around March following suspected viral illness.  Had tested negative for COVID and flu and was treated with azithromycin with improvement, though symptoms then returned.  At visit endorsed persistent diarrhea with bowel movements 1-3 times a day.  No rectal bleeding.  Had tried probiotics for couple months without improvement.  Endorse associated fecal urgency and incontinence/seepage.  Prior to symptom onset was having 1 formed bowel movement daily.   Labs were normal without evidence of infection or anemia, normal liver and kidney function, negative inflammatory markers, negative celiac disease.  Stool studies negative for infection.   Dr. Wilhelmenia suggested checking a fecal pancreatic elastase and fecal fat, as well as empiric treatment with Xifaxan for IBS-D if negative.   Fecal pancreatic elastase and fecal fat were normal.  At last visit 06/21/2024 we discussed empiric treatment for IBS-D with Xifaxan versus further evaluation with colonoscopy.  Patient opted to proceed with endoscopic evaluation.  She does have history of  adenomatous colon polyps as well as family history of colon cancer, last colonoscopy 01/2021.  She underwent colonoscopy 07/25/2024.  Found to have a redundant colon with significant looping, 3 polyps removed, internal and external hemorrhoids.  Pathology came back showing tubular adenomas, no evidence of microscopic colitis.  3-year recall recommended.   Discussed the use of AI scribe software for clinical note transcription with the patient, who gave verbal consent to proceed.  History of Present Illness Martha Hendricks is a 73 year old female who presents with diarrhea and bowel leakage for follow-up after a colonoscopy.   Diarrhea and bowel leakage - Diarrhea has significantly improved since the last visit, with only one episode this morning. - Diarrheal symptoms correlate with psychological stress, especially when feeling 'addled or stressed' by her husband. - No associated abdominal pain. - Bowel leakage has improved.  Colonoscopy and diagnostic evaluation - Colonoscopy performed in October included biopsies. - Microscopic colitis and inflammatory bowel disease have been ruled out. - Previous testing ruled out infection. - Pancreatic enzyme levels are normal.  Dietary and pharmacologic interventions - Started Benefiber (fiber supplement) approximately one week ago. - Currently taking one tablespoon of Benefiber daily.  Psychological symptoms and management - Chronic anxiety managed with Celexa  for several years. - Difficulty discontinuing Celexa . - History of counseling.   Previous GI Procedures/Imaging   Colonoscopy 07/25/2024 - Perianal skin tags found on perianal exam. Hemorrhoids found on digital rectal exam.  - Redundant colon leading to significant looping.  - Three 2 to 8 mm polyps in the ascending colon, removed with a cold snare. Resected and retrieved.  - Diverticulosis at the splenic flexure.  - Normal  mucosa in the entire examined colon otherwise. Biopsied.  -  Non-bleeding non-thrombosed external and internal hemorrhoids. - Recall 3 years Path: 1. Surgical [P], colon nos, random sites :       COLONIC MUCOSA WITH NO SIGNIFICANT DIAGNOSTIC ALTERATION.       NO EVIDENCE OF LYMPHOCYTIC COLITIS OR COLLAGENOUS COLITIS.       NEGATIVE FOR ACTIVITY, CHRONICITY, GRANULOMA, DYSPLASIA OR MALIGNANCY.        2. Surgical [P], colon, ascending, polyp (3) :       FRAGMENTS OF TUBULAR ADENOMA.       NEGATIVE FOR HIGH-GRADE DYSPLASIA.   Colonoscopy 01/29/2021 (Dr. Golda, [facility]) - Perianal skin tags found on perianal exam.  - Two small polyps at the hepatic flexure and in the ascending colon, removed with a cold snare. Resected and retrieved.  - One diminutive polyp at the splenic flexure. Biopsied.  - Diverticulosis at the splenic flexure.  - External hemorrhoids. - Recall 5 years Path: A. COLON, ASCENDING, HEPATIC FLEXURE, SPLENIC FLEXURE, POLYPECTOMY:  - Tubular adenoma(s) without high-grade dysplasia or malignancy  - Sessile serrated polyp without cytologic dysplasia    EGD 12/20/2019 *Report unavailable   Path: FINAL PATHOLOGIC DIAGNOSIS MICROSCOPIC EXAMINATION AND DIAGNOSIS A.  STOMACH, BIOPSY:      Mild chronic gastritis with reactive features.      No H. pylori on routine histology.      Few scattered lamina propria eosinophils. B.  ESOPHAGUS, DISTAL, BIOPSY:      Benign squamous mucosa.      No intraepithelial eosinophils. C.  ESOPHAGUS, PROXIMAL, BIOPSY:      Benign squamous mucosa.      No intraepithelial eosinophils.    Colonoscopy 03/18/2016 (Dr. Golda, [facility]) - The entire examined colon is normal.  - External hemorrhoids.  - Anal papilla( e) were hypertrophied. - No specimens collected   Past Medical History:  Diagnosis Date   Cancer [facility])    thyroid  cancer   Colon polyps    Depression    Hyperlipidemia    Hypothyroidism    Migraines    Status post dilation of esophageal narrowing     Past  Surgical History:  Procedure Laterality Date   ABDOMINAL HYSTERECTOMY     APPENDECTOMY  11/17/2000   BILATERAL SALPINGOOPHORECTOMY  11/17/2000   BIOPSY  01/29/2021   Procedure: BIOPSY;  Surgeon: Golda Claudis PENNER, MD;  [facility];  Service: Endoscopy;;   COLONOSCOPY N/A 03/18/2016   Procedure: COLONOSCOPY;  Surgeon: Claudis PENNER Golda, MD;  Location: [facility];  Service: Endoscopy;  Laterality: N/A;  930   COLONOSCOPY N/A 01/29/2021   Procedure: COLONOSCOPY;  Surgeon: Golda Claudis PENNER, MD;  [facility]: [facility];  Service: Endoscopy;  Laterality: N/A;  AM   ECTOPIC PREGNANCY SURGERY     ESOPHAGOGASTRODUODENOSCOPY (EGD) WITH ESOPHAGEAL DILATION     MASS EXCISION Left 01/15/2016   Procedure: EXCISION MASS LEFT MIDDLE FINGER;  Surgeon: Arley Curia, MD;  Location: [facility];  Service: Orthopedics;  Laterality: Left;   POLYPECTOMY  01/29/2021   Procedure: POLYPECTOMY;  Surgeon: Golda Claudis PENNER, MD;  [facility]: [facility];  Service: Endoscopy;;   THYROIDECTOMY N/A 03/21/2015   Procedure: TOTAL THYROIDECTOMY;  Surgeon: Marlyce Finer, MD;  Location: [facility] OR;  Service: ENT;  Laterality: N/A;   TONSILLECTOMY      Current Outpatient Medications  Medication Sig Dispense Refill   aspirin-acetaminophen -caffeine  (EXCEDRIN MIGRAINE) 250-250-65 MG tablet Take 1 tablet by  mouth every 8 (eight) hours as needed for headache.      Calcium  Carbonate-Vit D-Min (CALCIUM  1200 PO) Take 1,200 mg by mouth daily.     cholecalciferol (VITAMIN D ) 1000 units tablet Take 1,000 Units by mouth daily.     citalopram  (CELEXA ) 20 MG tablet Take 20 mg by mouth daily.     COVID-19 mRNA Vac-TriS, Pfizer, (PFIZER-BIONT COVID-19 VAC-TRIS) SUSP injection Inject into the muscle. 0.3 mL 0   diphenoxylate-atropine (LOMOTIL) 2.5-0.025 MG tablet Take 1 tablet by mouth as needed for diarrhea or loose stools.     estradiol  (ESTRACE ) 0.5 MG tablet Take 0.5 mg by mouth daily.     Glucosamine-Chondroitin  (COSAMIN DS) 500-400 MG CAPS 400 mg daily.     latanoprost (XALATAN) 0.005 % ophthalmic solution SMARTSIG:In Eye(s)     levothyroxine  (SYNTHROID ) 100 MCG tablet Take 100 mcg by mouth daily before breakfast.     liothyronine (CYTOMEL) 5 MCG tablet Take 5 mcg by mouth every morning.     Multiple Vitamins-Minerals (CENTRUM SILVER 50+WOMEN PO) Take by mouth daily.     omeprazole (PRILOSEC) 20 MG capsule Take 20 mg by mouth at bedtime.     Polyvinyl Alcohol-Povidone PF (REFRESH) 1.4-0.6 % SOLN Place 1 drop into both eyes daily as needed (dry eyes).     rosuvastatin (CRESTOR) 10 MG tablet Take 10 mg by mouth at bedtime.     tretinoin (RETIN-A) 0.1 % cream Apply 0.1 % topically at bedtime.     Varenicline Tartrate (TYRVAYA) 0.03 MG/ACT SOLN Place 0.03 mg into alternate nostrils as needed.     No current facility-administered medications for this visit.    Allergies as of 09/20/2024 - Review Complete 09/20/2024  Allergen Reaction Noted   Morphine and codeine Nausea Only 09/05/2013    Family History  Problem Relation Age of Onset   Prostate cancer Father    Prostate cancer Brother    Colon polyps Brother    Colon cancer Paternal Aunt    Stomach cancer Maternal Grandmother    Colon cancer Paternal Grandmother    Esophageal cancer Neg Hx    Rectal cancer Neg Hx     Social History   Tobacco Use   Smoking status: Never   Smokeless tobacco: Never  Vaping Use   Vaping status: Never Used  Substance Use Topics   Alcohol use: Yes    Comment: occ   Drug use: No     Review of Systems:     Gastrointestinal: See HPI and otherwise negative    Physical Exam:  Vital signs: BP 108/74   Pulse 71   Ht 5' 5 (1.651 m)   Wt 147 lb 2 oz (66.7 kg)   BMI 24.48 kg/m   Wt Readings from Last 3 Encounters:  09/20/24 147 lb 2 oz (66.7 kg)  07/25/24 150 lb (68 kg)  06/21/24 150 lb 6 oz (68.2 kg)     Constitutional: Pleasant, well-appearing female in NAD, alert and cooperative Head:   Normocephalic and atraumatic.  Eyes: No scleral icterus.  Respiratory: Respirations even and unlabored. Lungs clear to auscultation bilaterally.  No wheezes, crackles, or rhonchi.  Cardiovascular:  Regular rate and rhythm. No murmurs. No peripheral edema. Gastrointestinal:  Soft, nondistended, nontender. No rebound or guarding. Normal bowel sounds. No appreciable masses or hepatomegaly. Rectal:  Not performed.  Neurologic:  Alert and oriented x4;  grossly normal neurologically.  Skin:   Dry and intact without significant lesions or rashes. Psychiatric: Oriented to person, place  and time. Demonstrates good judgement and reason without abnormal affect or behaviors.   RELEVANT LABS AND IMAGING: CBC    Component Value Date/Time   WBC 5.3 05/01/2024 1421   RBC 4.88 05/01/2024 1421   HGB 14.7 05/01/2024 1421   HCT 44.3 05/01/2024 1421   PLT 178.0 05/01/2024 1421   MCV 90.7 05/01/2024 1421   MCH 30.3 03/19/2015 1331   MCHC 33.1 05/01/2024 1421   RDW 13.8 05/01/2024 1421   LYMPHSABS 2.4 05/01/2024 1421   MONOABS 0.5 05/01/2024 1421   EOSABS 0.1 05/01/2024 1421   BASOSABS 0.0 05/01/2024 1421    CMP     Component Value Date/Time   NA 137 05/01/2024 1421   K 4.1 05/01/2024 1421   CL 101 05/01/2024 1421   CO2 30 05/01/2024 1421   GLUCOSE 101 (H) 05/01/2024 1421   BUN 11 05/01/2024 1421   CREATININE 0.72 05/01/2024 1421   CALCIUM  9.1 05/01/2024 1421   PROT 7.3 05/01/2024 1421   ALBUMIN 4.4 05/01/2024 1421   AST 21 05/01/2024 1421   ALT 23 05/01/2024 1421   ALKPHOS 52 05/01/2024 1421   BILITOT 0.5 05/01/2024 1421   GFRNONAA >60 03/23/2015 0600   GFRAA >60 03/23/2015 0600     Assessment/Plan:   Assessment & Plan Irritable bowel syndrome with diarrhea Fecal smearing Intermittent diarrhea likely due to IBS, improving but present during stress. Previous tests negative for infection, microscopic colitis, and inflammatory bowel disease. No abdominal pain.  Has noticed improvement  in both diarrhea and fecal smearing with fiber supplementation.  - Continue Benefiber, starting with a tablespoon daily, adjust as needed. - Consider Fibercon tablets as alternative. - High-fiber diet handout given - If symptoms worsen consider empiric treatment for IBS-D with Xifaxan - If worsening fecal smearing, can refer to pelvic floor physical therapy - Advised continued stress management  - Follow up as needed  History of colonic polyps Colonoscopy in October revealed 3 tubular adenomas.  Will be due for repeat colonoscopy in 3 years.  On recall list.    Camie Furbish, PA-C [facility] 09/20/2024, 10:14 AM  Patient Care Team: Sheryle Carwin, MD as PCP - General (Internal Medicine)
